# Patient Record
Sex: Male | Born: 1990
Health system: Southern US, Community
[De-identification: ages and names within clinical notes are randomized; demographics above are authoritative.]

## PROBLEM LIST (undated history)

## (undated) DIAGNOSIS — K610 Anal abscess: Secondary | ICD-10-CM

## (undated) DIAGNOSIS — K5190 Ulcerative colitis, unspecified, without complications: Secondary | ICD-10-CM

## (undated) DIAGNOSIS — F419 Anxiety disorder, unspecified: Secondary | ICD-10-CM

## (undated) DIAGNOSIS — K625 Hemorrhage of anus and rectum: Secondary | ICD-10-CM

## (undated) DIAGNOSIS — F1910 Other psychoactive substance abuse, uncomplicated: Secondary | ICD-10-CM

## (undated) DIAGNOSIS — R011 Cardiac murmur, unspecified: Secondary | ICD-10-CM

## (undated) DIAGNOSIS — K5120 Ulcerative (chronic) proctitis without complications: Secondary | ICD-10-CM

## (undated) DIAGNOSIS — K529 Noninfective gastroenteritis and colitis, unspecified: Secondary | ICD-10-CM

## (undated) DIAGNOSIS — N200 Calculus of kidney: Secondary | ICD-10-CM

## (undated) HISTORY — DX: Anal abscess: K61.0

## (undated) HISTORY — DX: Ulcerative colitis, unspecified, without complications: K51.90

## (undated) HISTORY — DX: Hemorrhage of anus and rectum: K62.5

## (undated) HISTORY — DX: Cardiac murmur, unspecified: R01.1

## (undated) HISTORY — DX: Noninfective gastroenteritis and colitis, unspecified: K52.9

## (undated) HISTORY — DX: Anxiety disorder, unspecified: F41.9

## (undated) HISTORY — PX: OTHER SURGICAL HISTORY: SHX169

---
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
## 2009-02-05 LAB — HEPATIC FUNCTION PANEL
AST: 24 U/L (ref 14–40)
Bilirubin, Total: 1.5 mg/dL

## 2009-07-23 HISTORY — PX: SIGMOIDOSCOPY: SUR1295

## 2009-11-02 ENCOUNTER — Ambulatory Visit: Payer: Self-pay | Admitting: Internal Medicine

## 2009-11-30 ENCOUNTER — Ambulatory Visit: Payer: Self-pay | Admitting: Internal Medicine

## 2010-01-25 ENCOUNTER — Ambulatory Visit: Admit: 2010-01-25 | Payer: Self-pay | Admitting: Internal Medicine

## 2010-02-14 ENCOUNTER — Ambulatory Visit: Admit: 2010-02-14 | Payer: Self-pay | Admitting: Internal Medicine

## 2010-03-01 ENCOUNTER — Ambulatory Visit (INDEPENDENT_AMBULATORY_CARE_PROVIDER_SITE_OTHER): Payer: BC Managed Care – PPO | Admitting: Internal Medicine

## 2010-03-01 DIAGNOSIS — K5190 Ulcerative colitis, unspecified, without complications: Secondary | ICD-10-CM

## 2010-04-26 ENCOUNTER — Ambulatory Visit (INDEPENDENT_AMBULATORY_CARE_PROVIDER_SITE_OTHER): Payer: BC Managed Care – PPO | Admitting: Internal Medicine

## 2010-04-26 DIAGNOSIS — K5190 Ulcerative colitis, unspecified, without complications: Secondary | ICD-10-CM

## 2010-05-09 NOTE — Consult Note (Signed)
  Benjamin Sheppard, Benjamin Sheppard               ACCOUNT NO.:  [id number]  MEDICAL RECORD NO.:  [id number]          PATIENT TYPE: AMB.  LOCATION:  [place].                   [facility]  PHYSICIAN:  Hildred Laser, M.D.          DATE :  04/27/2010                                OFFICE VISIT.   REASON FOR CONSULTATION:  Followup of ulcerative colitis.  HISTORY OF PRESENT ILLNESS:  Benjamin Sheppard is a 20 year old white male who is a patient of Dr. Scotty Sheppard.  He was diagnosed with distal ulcerative colitis in July 2011.  He is presently on Apriso for his ulcerative colitis.  He was on Lialda but his symptoms were not controlled.  His mother is present in the room.  Today, he complains of having sharp crampy pain and then he will have to go to the bathroom.  He states for the past 2 weeks he has been having 4-15 stools a day.  His Apriso was started in November.  He feels that the Benjamin Sheppard is not working.  He denies any rectal bleeding.  He has been under a lot of stress.  When he has his bowel movements, they are loose and watery.  He denies any rectal bleeding or melena.  He has had no mucous.  HOME MEDICATIONS: 1. Dicyclomine 10 mg twice a day. 2. Zyrtec 10 mg 1 a day. 3. Fluticasone nasal spray, 2 sprays every nostril every day. 4. Lorazepam 0.5 one-half tablet p.r.n. 5. Apriso 4 tablets a day.  OBJECTIVE:  VITAL SIGNS:  His weight is 162.4, he is 5 feet 11 inches, his blood pressure is 100/68, his temperature is 97.9, and his pulse is 80. HEENT:  His oral mucosa is moist.  His conjunctivae are pink.  Sclerae are anicteric. NECK:  His thyroid is normal. LUNGS:  Clear. HEART:  Regular rate and rhythm. ABDOMEN:  Soft.  Bowel sounds are present.  There is no tenderness to his abdomen.  LABORATORY DATA:  His stool today was guaiac negative and brown.  ASSESSMENT:  Benjamin Sheppard is a 20 year old male with distal ulcerative colitis.  He is presently taking Apriso 4 capsules a day.  He is  having 4-15 stools a day.  I did discuss this case with Dr. Laural Sheppard.  He possibly could be having a relapse of his ulcerative colitis.  RECOMMENDATIONS:  He will increase his Imodium to twice a day.  I will get a CBC and a C-reactive protein on him.  He will continue his Apriso. I did encourage him to take his lorazepam on an as-needed basis for his stress.  He will follow up in 1 month with Dr. Laural Sheppard.  Further recommendations once we get his lab work back.    ______________________________ Deberah Castle, NP   ______________________________ Hildred Laser, M.D.    TS/MEDQ  D:  04/27/2010  T:  04/27/2010  Job:  [id number]  Electronically Signed by Deberah Castle PA on 05/03/2010 02:42:03 PM Electronically Signed by Hildred Laser M.D. on 05/08/2010 11:33:33 PM

## 2010-06-21 ENCOUNTER — Ambulatory Visit (INDEPENDENT_AMBULATORY_CARE_PROVIDER_SITE_OTHER): Payer: BC Managed Care – PPO | Admitting: Internal Medicine

## 2010-06-23 ENCOUNTER — Ambulatory Visit (INDEPENDENT_AMBULATORY_CARE_PROVIDER_SITE_OTHER): Payer: BC Managed Care – PPO | Admitting: Internal Medicine

## 2010-06-23 DIAGNOSIS — K5190 Ulcerative colitis, unspecified, without complications: Secondary | ICD-10-CM

## 2010-07-08 ENCOUNTER — Encounter (INDEPENDENT_AMBULATORY_CARE_PROVIDER_SITE_OTHER): Payer: BC Managed Care – PPO | Admitting: Internal Medicine

## 2010-07-11 ENCOUNTER — Ambulatory Visit (INDEPENDENT_AMBULATORY_CARE_PROVIDER_SITE_OTHER): Payer: BC Managed Care – PPO | Admitting: Internal Medicine

## 2010-07-11 DIAGNOSIS — K529 Noninfective gastroenteritis and colitis, unspecified: Secondary | ICD-10-CM

## 2010-07-11 DIAGNOSIS — K625 Hemorrhage of anus and rectum: Secondary | ICD-10-CM

## 2010-07-11 DIAGNOSIS — K5190 Ulcerative colitis, unspecified, without complications: Secondary | ICD-10-CM

## 2010-07-11 HISTORY — DX: Ulcerative colitis, unspecified, without complications: K51.90

## 2010-07-11 HISTORY — DX: Hemorrhage of anus and rectum: K62.5

## 2010-07-11 HISTORY — DX: Noninfective gastroenteritis and colitis, unspecified: K52.9

## 2010-08-19 ENCOUNTER — Encounter (INDEPENDENT_AMBULATORY_CARE_PROVIDER_SITE_OTHER): Payer: Self-pay

## 2010-08-22 ENCOUNTER — Telehealth (INDEPENDENT_AMBULATORY_CARE_PROVIDER_SITE_OTHER): Payer: Self-pay | Admitting: *Deleted

## 2010-08-22 DIAGNOSIS — K5190 Ulcerative colitis, unspecified, without complications: Secondary | ICD-10-CM

## 2010-08-22 NOTE — Telephone Encounter (Signed)
[name] called and states that Benjamin Sheppard is having diarrhea , and that the medication noted is making him sick (sulfasalazine). He is very tired and cannot eat. She ask that her cell be called with recommendations.

## 2010-08-23 MED ORDER — MERCAPTOPURINE 50 MG PO TABS: 50.0000 mg | ORAL_TABLET | Freq: Every day | ORAL | Status: DC

## 2010-08-23 MED ORDER — PREDNISONE 10 MG PO TABS: 30.0000 mg | ORAL_TABLET | Freq: Every day | ORAL | Status: DC

## 2010-08-25 ENCOUNTER — Ambulatory Visit (INDEPENDENT_AMBULATORY_CARE_PROVIDER_SITE_OTHER): Payer: BC Managed Care – PPO | Admitting: Internal Medicine

## 2010-09-05 ENCOUNTER — Ambulatory Visit (INDEPENDENT_AMBULATORY_CARE_PROVIDER_SITE_OTHER): Payer: BC Managed Care – PPO | Admitting: Internal Medicine

## 2010-09-05 ENCOUNTER — Encounter (INDEPENDENT_AMBULATORY_CARE_PROVIDER_SITE_OTHER): Payer: Self-pay | Admitting: Internal Medicine

## 2010-09-05 DIAGNOSIS — K5190 Ulcerative colitis, unspecified, without complications: Secondary | ICD-10-CM

## 2010-09-05 NOTE — Progress Notes (Signed)
Presenting complaint; followup for ulcerative colitis Last visit 07/11/2010. Subjective; Benjamin Sheppard is here for scheduled visit accompanied by his mother Tye [place]. His TPMT was normal and he was begun on 6 MP 2 weeks ago. So far so good. He hasn't had any problems. He will be dropping dose of prednisone to 20 mg daily tomorrow thereafter by 5 mg every week. He is having formed stools daily. He has not seen blood with his stools since his last visit he has a very good appetite however his weight is unchanged. He has noted some fullness to his lower extremities in the evening and also has noted fullness to his face. He is admitted at this is side effect of prednisone and should resolve when he is off therapy. 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 cetirizine (ZYRTEC) 10 MG tablet Take 10 mg by mouth daily.        . mercaptopurine (PURINETHOL) 50 MG tablet Take 1 tablet (50 mg total) by mouth daily. Give on an empty stomach 1 hour before or 2 hours after meals. Caution: Chemotherapy.  30 tablet  5   objective; BP 130/74  Pulse 72  Temp(Src) 98.4 F (36.9 C) (Oral)  Ht 5' 11"  (1.803 m)  Wt 167 lb (75.751 kg)  BMI 23.29 kg/m2 He has slight round facies. Conjunctiva is pink. Sclerae nonicteric. Oropharyngeal mucosa is normal. No neck masses or thyromegaly noted. His abdomen is symmetrical soft and nontender without organomegaly or masses. No peripheral edema or clubbing noted. Assessment; Distal UC difficult to control with oral mesalamine. Hopefully with 6-MP we can induce long-term remission. Recommendations Continue prednisone taper per schedule. CBC with diff. end of the month. Office visit in 3 months.

## 2010-09-05 NOTE — Patient Instructions (Signed)
Continue prednisone taper  per schedule. CBC in 2 weeks.

## 2010-09-12 ENCOUNTER — Other Ambulatory Visit (INDEPENDENT_AMBULATORY_CARE_PROVIDER_SITE_OTHER): Payer: Self-pay | Admitting: *Deleted

## 2010-09-12 DIAGNOSIS — R197 Diarrhea, unspecified: Secondary | ICD-10-CM

## 2010-09-12 MED ORDER — DICYCLOMINE HCL 10 MG PO CAPS: ORAL_CAPSULE | ORAL | Status: DC

## 2010-09-12 NOTE — Telephone Encounter (Signed)
Rec'd a refill request for a refill Dicyclomine. Last filled 06-21-2010

## 2010-09-13 ENCOUNTER — Ambulatory Visit (INDEPENDENT_AMBULATORY_CARE_PROVIDER_SITE_OTHER): Payer: BC Managed Care – PPO | Admitting: Internal Medicine

## 2010-09-24 ENCOUNTER — Other Ambulatory Visit (INDEPENDENT_AMBULATORY_CARE_PROVIDER_SITE_OTHER): Payer: Self-pay | Admitting: Internal Medicine

## 2010-09-24 LAB — CBC WITH DIFFERENTIAL/PLATELET
Basophils Absolute: 0 10*3/uL (ref 0.0–0.1)
HCT: 42.3 % (ref 39.0–52.0)
Hemoglobin: 14.2 g/dL (ref 13.0–17.0)
Lymphocytes Relative: 43 % (ref 12–46)
MCH: 29.6 pg (ref 26.0–34.0)
MCHC: 33.6 g/dL (ref 30.0–36.0)
Monocytes Relative: 7 % (ref 3–12)
Neutro Abs: 2.7 10*3/uL (ref 1.7–7.7)
Neutrophils Relative %: 48 % (ref 43–77)
Platelets: 236 10*3/uL (ref 150–400)
RBC: 4.79 MIL/uL (ref 4.22–5.81)
RDW: 15.4 % (ref 11.5–15.5)

## 2010-09-29 ENCOUNTER — Telehealth (INDEPENDENT_AMBULATORY_CARE_PROVIDER_SITE_OTHER): Payer: Self-pay | Admitting: *Deleted

## 2010-09-29 NOTE — Telephone Encounter (Signed)
Lab due in 8 weeks

## 2010-11-30 ENCOUNTER — Encounter (INDEPENDENT_AMBULATORY_CARE_PROVIDER_SITE_OTHER): Payer: Self-pay | Admitting: *Deleted

## 2010-11-30 ENCOUNTER — Telehealth (INDEPENDENT_AMBULATORY_CARE_PROVIDER_SITE_OTHER): Payer: Self-pay | Admitting: *Deleted

## 2010-11-30 NOTE — Telephone Encounter (Signed)
Lab order faxed.

## 2010-12-17 ENCOUNTER — Other Ambulatory Visit (INDEPENDENT_AMBULATORY_CARE_PROVIDER_SITE_OTHER): Payer: Self-pay | Admitting: Internal Medicine

## 2010-12-18 LAB — CBC WITH DIFFERENTIAL/PLATELET
HCT: 40.9 % (ref 39.0–52.0)
Hemoglobin: 14 g/dL (ref 13.0–17.0)
MCH: 32 pg (ref 26.0–34.0)
MCV: 93.4 fL (ref 78.0–100.0)
Platelets: 234 10*3/uL (ref 150–400)
RBC: 4.38 MIL/uL (ref 4.22–5.81)
RDW: 14.6 % (ref 11.5–15.5)

## 2010-12-21 ENCOUNTER — Telehealth (INDEPENDENT_AMBULATORY_CARE_PROVIDER_SITE_OTHER): Payer: Self-pay | Admitting: *Deleted

## 2010-12-21 NOTE — Telephone Encounter (Signed)
Per Dr. Karilyn Cota the patient will need a CBC/d in 3 months.

## 2011-02-13 ENCOUNTER — Other Ambulatory Visit (INDEPENDENT_AMBULATORY_CARE_PROVIDER_SITE_OTHER): Payer: Self-pay | Admitting: Internal Medicine

## 2011-02-13 DIAGNOSIS — K5120 Ulcerative (chronic) proctitis without complications: Secondary | ICD-10-CM

## 2011-03-01 ENCOUNTER — Encounter (INDEPENDENT_AMBULATORY_CARE_PROVIDER_SITE_OTHER): Payer: Self-pay | Admitting: *Deleted

## 2011-03-27 ENCOUNTER — Other Ambulatory Visit (INDEPENDENT_AMBULATORY_CARE_PROVIDER_SITE_OTHER): Payer: Self-pay | Admitting: Internal Medicine

## 2011-03-28 LAB — CBC WITH DIFFERENTIAL/PLATELET
Basophils Relative: 1 % (ref 0–1)
Eosinophils Relative: 1 % (ref 0–5)
HCT: 41.5 % (ref 39.0–52.0)
Hemoglobin: 14.2 g/dL (ref 13.0–17.0)
Lymphs Abs: 1.3 10*3/uL (ref 0.7–4.0)
MCH: 31 pg (ref 26.0–34.0)
MCHC: 34.2 g/dL (ref 30.0–36.0)
MCV: 90.6 fL (ref 78.0–100.0)
Neutro Abs: 3.5 10*3/uL (ref 1.7–7.7)
Neutrophils Relative %: 64 % (ref 43–77)
Platelets: 265 10*3/uL (ref 150–400)
RBC: 4.58 MIL/uL (ref 4.22–5.81)
RDW: 14.9 % (ref 11.5–15.5)
WBC: 5.5 10*3/uL (ref 4.0–10.5)

## 2011-03-29 ENCOUNTER — Telehealth (INDEPENDENT_AMBULATORY_CARE_PROVIDER_SITE_OTHER): Payer: Self-pay | Admitting: *Deleted

## 2011-03-29 NOTE — Telephone Encounter (Signed)
Per Dr. Laural Golden the patient will need a CBC/D in 3 months. Lab is noted for June 06-13. Patient will be sent a letter as a reminder.

## 2011-06-14 ENCOUNTER — Other Ambulatory Visit (INDEPENDENT_AMBULATORY_CARE_PROVIDER_SITE_OTHER): Payer: Self-pay | Admitting: Internal Medicine

## 2011-06-23 ENCOUNTER — Other Ambulatory Visit (INDEPENDENT_AMBULATORY_CARE_PROVIDER_SITE_OTHER): Payer: Self-pay | Admitting: *Deleted

## 2011-06-23 ENCOUNTER — Encounter (INDEPENDENT_AMBULATORY_CARE_PROVIDER_SITE_OTHER): Payer: Self-pay | Admitting: *Deleted

## 2011-07-06 ENCOUNTER — Telehealth (INDEPENDENT_AMBULATORY_CARE_PROVIDER_SITE_OTHER): Payer: Self-pay | Admitting: *Deleted

## 2011-07-06 DIAGNOSIS — K5190 Ulcerative colitis, unspecified, without complications: Secondary | ICD-10-CM

## 2011-07-06 LAB — CBC WITH DIFFERENTIAL/PLATELET
Eosinophils Absolute: 0.1 10*3/uL (ref 0.0–0.7)
HCT: 43.7 % (ref 39.0–52.0)
Hemoglobin: 14.7 g/dL (ref 13.0–17.0)
Lymphs Abs: 1.5 10*3/uL (ref 0.7–4.0)
MCH: 31.3 pg (ref 26.0–34.0)
MCV: 93.2 fL (ref 78.0–100.0)
Monocytes Absolute: 0.5 10*3/uL (ref 0.1–1.0)
Neutro Abs: 3.4 10*3/uL (ref 1.7–7.7)
Neutrophils Relative %: 63 % (ref 43–77)
RBC: 4.69 MIL/uL (ref 4.22–5.81)

## 2011-07-06 NOTE — Telephone Encounter (Signed)
Per Dr.Rehman the patient will need lab work again in 3 months. Labs are noted for September 2013

## 2011-07-17 ENCOUNTER — Telehealth (INDEPENDENT_AMBULATORY_CARE_PROVIDER_SITE_OTHER): Payer: Self-pay | Admitting: *Deleted

## 2011-07-17 NOTE — Telephone Encounter (Signed)
[facility] has requested a refill on Mercaptopurine 50 mg, take 1 tablet by mouth daily on empty stomach - 1 hour before or 2 hours after a meal. Caution Chemotherapy.  Patient is going out of town and will need #35-#40 pills to have enough for trip.

## 2011-07-18 ENCOUNTER — Other Ambulatory Visit (INDEPENDENT_AMBULATORY_CARE_PROVIDER_SITE_OTHER): Payer: Self-pay | Admitting: Internal Medicine

## 2011-07-18 NOTE — Telephone Encounter (Signed)
This has been addressed.

## 2011-09-21 ENCOUNTER — Encounter (INDEPENDENT_AMBULATORY_CARE_PROVIDER_SITE_OTHER): Payer: Self-pay | Admitting: *Deleted

## 2011-09-21 ENCOUNTER — Other Ambulatory Visit (INDEPENDENT_AMBULATORY_CARE_PROVIDER_SITE_OTHER): Payer: Self-pay | Admitting: *Deleted

## 2011-09-21 DIAGNOSIS — K5190 Ulcerative colitis, unspecified, without complications: Secondary | ICD-10-CM

## 2011-11-03 LAB — CBC WITH DIFFERENTIAL/PLATELET
Basophils Relative: 2 % — ABNORMAL HIGH (ref 0–1)
Eosinophils Relative: 2 % (ref 0–5)
HCT: 38.2 % — ABNORMAL LOW (ref 39.0–52.0)
MCH: 30.8 pg (ref 26.0–34.0)
MCV: 86.4 fL (ref 78.0–100.0)
Monocytes Absolute: 0.6 10*3/uL (ref 0.1–1.0)
Monocytes Relative: 11 % (ref 3–12)
Neutrophils Relative %: 68 % (ref 43–77)
RDW: 13.9 % (ref 11.5–15.5)

## 2011-11-08 ENCOUNTER — Telehealth (INDEPENDENT_AMBULATORY_CARE_PROVIDER_SITE_OTHER): Payer: Self-pay | Admitting: *Deleted

## 2011-11-08 DIAGNOSIS — K5190 Ulcerative colitis, unspecified, without complications: Secondary | ICD-10-CM

## 2011-11-08 NOTE — Telephone Encounter (Signed)
Per Dr.Rehman the patient will need to have CBC/D in 3 months this has been noted for January 2014.

## 2012-01-04 ENCOUNTER — Encounter (INDEPENDENT_AMBULATORY_CARE_PROVIDER_SITE_OTHER): Payer: Self-pay | Admitting: *Deleted

## 2012-01-04 ENCOUNTER — Telehealth (INDEPENDENT_AMBULATORY_CARE_PROVIDER_SITE_OTHER): Payer: Self-pay | Admitting: *Deleted

## 2012-01-04 DIAGNOSIS — K5190 Ulcerative colitis, unspecified, without complications: Secondary | ICD-10-CM

## 2012-01-04 NOTE — Telephone Encounter (Signed)
Lab order done 

## 2012-02-26 ENCOUNTER — Other Ambulatory Visit (INDEPENDENT_AMBULATORY_CARE_PROVIDER_SITE_OTHER): Payer: Self-pay | Admitting: Internal Medicine

## 2012-03-25 ENCOUNTER — Other Ambulatory Visit (INDEPENDENT_AMBULATORY_CARE_PROVIDER_SITE_OTHER): Payer: Self-pay | Admitting: Internal Medicine

## 2012-04-12 NOTE — Telephone Encounter (Signed)
LM for patient to return the call.  

## 2012-04-18 NOTE — Telephone Encounter (Signed)
LM for patient to return the call.  

## 2012-05-08 NOTE — Telephone Encounter (Signed)
Patient's mother called to scheduled an apt for 05/10/12. Our office will be closed for Easter Holiday. Mother said Aureliano is away at college and once she can get with him, she will call back.

## 2012-05-22 NOTE — Telephone Encounter (Signed)
LM that this will be his last reminder call. He is needing to scheduled a f/u apt with Dr. Karilyn Cota before his next refill.

## 2012-06-03 ENCOUNTER — Ambulatory Visit (INDEPENDENT_AMBULATORY_CARE_PROVIDER_SITE_OTHER): Payer: BC Managed Care – PPO | Admitting: Internal Medicine

## 2012-06-03 ENCOUNTER — Encounter (INDEPENDENT_AMBULATORY_CARE_PROVIDER_SITE_OTHER): Payer: Self-pay | Admitting: Internal Medicine

## 2012-06-03 VITALS — BP 112/72 | HR 60 | Ht 71.0 in | Wt 156.4 lb

## 2012-06-03 DIAGNOSIS — K5190 Ulcerative colitis, unspecified, without complications: Secondary | ICD-10-CM | POA: Insufficient documentation

## 2012-06-03 LAB — CBC WITH DIFFERENTIAL/PLATELET
Basophils Absolute: 0 10*3/uL (ref 0.0–0.1)
Eosinophils Absolute: 0.1 10*3/uL (ref 0.0–0.7)
Eosinophils Relative: 2 % (ref 0–5)
HCT: 44.9 % (ref 39.0–52.0)
Hemoglobin: 15.7 g/dL (ref 13.0–17.0)
Lymphocytes Relative: 21 % (ref 12–46)
MCH: 31 pg (ref 26.0–34.0)
MCV: 88.6 fL (ref 78.0–100.0)
Platelets: 213 10*3/uL (ref 150–400)
RBC: 5.07 MIL/uL (ref 4.22–5.81)
RDW: 14.1 % (ref 11.5–15.5)
WBC: 5.8 10*3/uL (ref 4.0–10.5)

## 2012-06-03 LAB — C-REACTIVE PROTEIN: CRP: <0.5 mg/dL (ref <0.60)

## 2012-06-03 NOTE — Progress Notes (Signed)
Subjective:     Patient ID: Benjamin Sheppard, male   DOB: 1990-01-25, 22 y.o.   MRN: [id number]  HPI Benjamin Sheppard is a 22 yr old male her today for follow up of his UC. Presently taking for his UC. He was last seen in 2012 by Dr. Karilyn Cota.  Presently taking . He tells me he is not having any problems. He has 1-2 stools a day. Stools are formed. His appetite is good. No weight loss.  No rectal bleeding in over a year.  No rectal  Pain or abdominal pain .Appetite is good.     Flexible sigmoid 2011: Distal coliltis involving the rectum and sigmoid colon with transition at 35 cm from the anal margin. Endoscopic appearance typical of UC.  Review of Systems See hpi  Current Outpatient Prescriptions  Medication Sig Dispense Refill  . mercaptopurine (PURINETHOL) 50 MG tablet TAKE ONE TABLET BY MOUTH DAILY ON EMPTY STOMACH - 1 HR BEFORE OR 2 HRS AFTER A MEAL. CAUTION CHEMOTHERAPY.  30 tablet  5   No current facility-administered medications for this visit.   Current Outpatient Prescriptions on File Prior to Visit  Medication Sig Dispense Refill  . mercaptopurine (PURINETHOL) 50 MG tablet TAKE ONE TABLET BY MOUTH DAILY ON EMPTY STOMACH - 1 HR BEFORE OR 2 HRS AFTER A MEAL. CAUTION CHEMOTHERAPY.  30 tablet  5   No current facility-administered medications on file prior to visit.   Past Medical History  Diagnosis Date  . Chronic diarrhea 07/11/2010  . Rectal bleeding 07/11/2010  . UC (ulcerative colitis) 07/11/2010   Past Surgical History  Procedure Laterality Date  . Sigmoidoscopy  07/2009   No Known Allergies       Objective:   Physical Exam  Filed Vitals:   06/03/12 1105  BP: 112/72  Pulse: 60  Height: 5\' 11"  (1.803 m)  Weight: 156 lb 6.4 oz (70.943 kg)   Alert and oriented. Skin warm and dry. Oral mucosa is moist.   . Sclera anicteric, conjunctivae is pink. Thyroid not enlarged. No cervical lymphadenopathy. Lungs clear. Heart regular rate and rhythm.  Abdomen is soft. Bowel sounds  are positive. No hepatomegaly. No abdominal masses felt. No tenderness.  No edema to lower extremities.       Assessment:    UC which seems to be in remission. No GI problems.  Maintained on ;.    Plan:     CBC, CRP today. OV in 1 yr.

## 2012-06-03 NOTE — Patient Instructions (Addendum)
OV in 1 yr. 

## 2012-09-09 ENCOUNTER — Other Ambulatory Visit (INDEPENDENT_AMBULATORY_CARE_PROVIDER_SITE_OTHER): Payer: Self-pay | Admitting: Internal Medicine

## 2013-04-01 ENCOUNTER — Encounter (INDEPENDENT_AMBULATORY_CARE_PROVIDER_SITE_OTHER): Payer: Self-pay | Admitting: *Deleted

## 2013-04-09 ENCOUNTER — Other Ambulatory Visit (INDEPENDENT_AMBULATORY_CARE_PROVIDER_SITE_OTHER): Payer: Self-pay | Admitting: Internal Medicine

## 2013-07-08 ENCOUNTER — Ambulatory Visit (INDEPENDENT_AMBULATORY_CARE_PROVIDER_SITE_OTHER): Payer: BC Managed Care – PPO | Admitting: Internal Medicine

## 2013-07-31 ENCOUNTER — Telehealth (INDEPENDENT_AMBULATORY_CARE_PROVIDER_SITE_OTHER): Payer: Self-pay | Admitting: *Deleted

## 2013-07-31 NOTE — Telephone Encounter (Signed)
Benjamin Sheppard had an apt on 07/08/13 that had to be cancelled due to the provided out of office. LM for patient to return the call on 06/07/13, 07/02/13 and 07/31/13.

## 2013-07-31 NOTE — Telephone Encounter (Signed)
Noted  

## 2013-08-26 ENCOUNTER — Other Ambulatory Visit (INDEPENDENT_AMBULATORY_CARE_PROVIDER_SITE_OTHER): Payer: Self-pay | Admitting: Internal Medicine

## 2013-08-26 MED ORDER — MERCAPTOPURINE 50 MG PO TABS: ORAL_TABLET | ORAL | Status: DC

## 2013-09-10 ENCOUNTER — Ambulatory Visit (INDEPENDENT_AMBULATORY_CARE_PROVIDER_SITE_OTHER): Payer: BC Managed Care – PPO | Admitting: Internal Medicine

## 2013-09-10 ENCOUNTER — Encounter (INDEPENDENT_AMBULATORY_CARE_PROVIDER_SITE_OTHER): Payer: Self-pay | Admitting: Internal Medicine

## 2013-09-10 VITALS — BP 106/70 | HR 80 | Temp 98.8°F | Ht 72.0 in | Wt 142.9 lb

## 2013-09-10 DIAGNOSIS — K5190 Ulcerative colitis, unspecified, without complications: Secondary | ICD-10-CM

## 2013-09-10 LAB — CBC WITH DIFFERENTIAL/PLATELET
BASOS ABS: 0 10*3/uL (ref 0.0–0.1)
Basophils Relative: 0 % (ref 0–1)
EOS ABS: 0.1 10*3/uL (ref 0.0–0.7)
Eosinophils Relative: 1 % (ref 0–5)
HEMATOCRIT: 43.5 % (ref 39.0–52.0)
HEMOGLOBIN: 14.9 g/dL (ref 13.0–17.0)
LYMPHS ABS: 1.6 10*3/uL (ref 0.7–4.0)
LYMPHS PCT: 19 % (ref 12–46)
MCH: 29.9 pg (ref 26.0–34.0)
MCHC: 34.3 g/dL (ref 30.0–36.0)
MCV: 87.3 fL (ref 78.0–100.0)
MONOS PCT: 8 % (ref 3–12)
Monocytes Absolute: 0.7 10*3/uL (ref 0.1–1.0)
NEUTROS ABS: 5.9 10*3/uL (ref 1.7–7.7)
NEUTROS PCT: 72 % (ref 43–77)
PLATELETS: 204 10*3/uL (ref 150–400)
RBC: 4.98 MIL/uL (ref 4.22–5.81)
RDW: 14.1 % (ref 11.5–15.5)
WBC: 8.2 10*3/uL (ref 4.0–10.5)

## 2013-09-10 NOTE — Patient Instructions (Signed)
OV in 1 yr with DR. Rehman

## 2013-09-10 NOTE — Progress Notes (Signed)
Subjective:     Patient ID: Benjamin GlassingJustin W Sheppard, male   DOB: 09/08/1990, 23 y.o.   MRN: [id number]  HPI Here today for f/u of his UC. Last seen in August of 2014. Last seen by Dr. Karilyn Cotaehman in 2012. Presently taking 6MP for his UC. He is having 1-3 stools a day. Stools are formed. No melena of BRRB. No abdominal pain. Appetite is good.  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 He says his work his hard, but entertaining.   He tells me his irritable is much better. Flexible sigmoid 2011: Distal coliltis involving the rectum and sigmoid colon with transition at 35 cm from the anal margin. Endoscopic appearance typical of UC.    06/03/2013 CRP less than 5 CBC    Component Value Date/Time   WBC 5.8 06/03/2012 1225   RBC 5.07 06/03/2012 1225   HGB 15.7 06/03/2012 1225   HCT 44.9 06/03/2012 1225   PLT 213 06/03/2012 1225   MCV 88.6 06/03/2012 1225   MCH 31.0 06/03/2012 1225   MCHC 35.0 06/03/2012 1225   RDW 14.1 06/03/2012 1225   LYMPHSABS 1.2 06/03/2012 1225   MONOABS 0.4 06/03/2012 1225   EOSABS 0.1 06/03/2012 1225   BASOSABS 0.0 06/03/2012 1225      Review of Systems Past Medical History  Diagnosis Date  . Chronic diarrhea 07/11/2010  . Rectal bleeding 07/11/2010  . UC (ulcerative colitis) 07/11/2010    Past Surgical History  Procedure Laterality Date  . Sigmoidoscopy  07/2009    No Known Allergies  Current Outpatient Prescriptions on File Prior to Visit  Medication Sig Dispense Refill  . mercaptopurine (PURINETHOL) 50 MG tablet TAKE ONE TABLET BY MOUTH ON EMPTY STOMACH ONE HOUR BEFORE MEALS OR TWO HOURS AFTER MEALS. (CAUTION: CHEMOTHERAPY)  30 tablet  0   No current facility-administered medications on file prior to visit.        Objective:   Physical Exam  Filed Vitals:   09/10/13 1445  BP: 106/70  Pulse: 80  Temp: 98.8 F (37.1 C)  Height: 6' (1.829 m)  Weight: 142 lb 14.4 oz (64.819 kg)   Alert and oriented. Skin warm and dry. Oral mucosa is moist.   .  Sclera anicteric, conjunctivae is pink. Thyroid not enlarged. No cervical lymphadenopathy. Lungs clear. Heart regular rate and rhythm.  Abdomen is soft. Bowel sounds are positive. No hepatomegaly. No abdominal masses felt. No tenderness.  No edema to lower extremities.       Assessment:     UC. He is doing very well. No GI problems. Presently taking low dose antidressant.      Plan:    CBC, CRP. OV in 1 yr with Dr. Karilyn Cotaehman.

## 2013-09-11 LAB — C-REACTIVE PROTEIN: CRP: <0.5 mg/dL (ref <0.60)

## 2013-10-02 ENCOUNTER — Other Ambulatory Visit (INDEPENDENT_AMBULATORY_CARE_PROVIDER_SITE_OTHER): Payer: Self-pay | Admitting: Internal Medicine

## 2014-04-27 ENCOUNTER — Other Ambulatory Visit (INDEPENDENT_AMBULATORY_CARE_PROVIDER_SITE_OTHER): Payer: Self-pay | Admitting: Internal Medicine

## 2014-06-09 ENCOUNTER — Encounter (INDEPENDENT_AMBULATORY_CARE_PROVIDER_SITE_OTHER): Payer: Self-pay | Admitting: *Deleted

## 2014-09-14 ENCOUNTER — Ambulatory Visit (INDEPENDENT_AMBULATORY_CARE_PROVIDER_SITE_OTHER): Payer: Self-pay | Admitting: Internal Medicine

## 2014-10-07 ENCOUNTER — Encounter (HOSPITAL_COMMUNITY): Payer: Self-pay | Admitting: *Deleted

## 2014-10-07 ENCOUNTER — Emergency Department (HOSPITAL_COMMUNITY)
Admission: EM | Admit: 2014-10-07 | Discharge: 2014-10-08 | Disposition: A | Discharge status: Other Acute Inpatient Hospital | Payer: BLUE CROSS/BLUE SHIELD | Attending: Emergency Medicine | Admitting: Emergency Medicine

## 2014-10-07 DIAGNOSIS — F1910 Other psychoactive substance abuse, uncomplicated: Secondary | ICD-10-CM

## 2014-10-07 DIAGNOSIS — F1310 Sedative, hypnotic or anxiolytic abuse, uncomplicated: Secondary | ICD-10-CM | POA: Diagnosis not present

## 2014-10-07 DIAGNOSIS — Z8719 Personal history of other diseases of the digestive system: Secondary | ICD-10-CM | POA: Diagnosis not present

## 2014-10-07 DIAGNOSIS — F1210 Cannabis abuse, uncomplicated: Secondary | ICD-10-CM | POA: Diagnosis not present

## 2014-10-07 DIAGNOSIS — Z79899 Other long term (current) drug therapy: Secondary | ICD-10-CM | POA: Insufficient documentation

## 2014-10-07 DIAGNOSIS — Z720 Tobacco use: Secondary | ICD-10-CM | POA: Diagnosis not present

## 2014-10-07 DIAGNOSIS — F329 Major depressive disorder, single episode, unspecified: Secondary | ICD-10-CM

## 2014-10-07 DIAGNOSIS — F32A Depression, unspecified: Secondary | ICD-10-CM

## 2014-10-07 DIAGNOSIS — R110 Nausea: Secondary | ICD-10-CM | POA: Diagnosis present

## 2014-10-07 LAB — COMPREHENSIVE METABOLIC PANEL
ALBUMIN: 4.4 g/dL (ref 3.5–5.0)
ALK PHOS: 52 U/L (ref 38–126)
ALT: 15 U/L — AB (ref 17–63)
ANION GAP: 6 (ref 5–15)
AST: 23 U/L (ref 15–41)
BUN: 12 mg/dL (ref 6–20)
CALCIUM: 9.2 mg/dL (ref 8.9–10.3)
CHLORIDE: 103 mmol/L (ref 101–111)
CO2: 29 mmol/L (ref 22–32)
CREATININE: 1.1 mg/dL (ref 0.61–1.24)
GFR calc Af Amer: >60 mL/min (ref >60)
GFR calc non Af Amer: >60 mL/min (ref >60)
GLUCOSE: 141 mg/dL — AB (ref 65–99)
Potassium: 4.1 mmol/L (ref 3.5–5.1)
SODIUM: 138 mmol/L (ref 135–145)
Total Bilirubin: 1.2 mg/dL (ref 0.3–1.2)
Total Protein: 7.3 g/dL (ref 6.5–8.1)

## 2014-10-07 LAB — CBC
HCT: 40.5 % (ref 39.0–52.0)
HEMOGLOBIN: 14.2 g/dL (ref 13.0–17.0)
MCH: 31.2 pg (ref 26.0–34.0)
MCHC: 35.1 g/dL (ref 30.0–36.0)
MCV: 89 fL (ref 78.0–100.0)
PLATELETS: 199 10*3/uL (ref 150–400)
RBC: 4.55 MIL/uL (ref 4.22–5.81)
RDW: 13 % (ref 11.5–15.5)
WBC: 8.1 10*3/uL (ref 4.0–10.5)

## 2014-10-07 LAB — RAPID URINE DRUG SCREEN, HOSP PERFORMED
AMPHETAMINES: NOT DETECTED
BARBITURATES: NOT DETECTED
Benzodiazepines: POSITIVE — AB
COCAINE: NOT DETECTED
Opiates: NOT DETECTED
TETRAHYDROCANNABINOL: POSITIVE — AB

## 2014-10-07 LAB — ETHANOL: Alcohol, Ethyl (B): <5 mg/dL (ref <5)

## 2014-10-07 LAB — SALICYLATE LEVEL: Salicylate Lvl: <4 mg/dL (ref 2.8–30.0)

## 2014-10-07 LAB — ACETAMINOPHEN LEVEL: Acetaminophen (Tylenol), Serum: <10 ug/mL — ABNORMAL LOW (ref 10–30)

## 2014-10-07 MED ORDER — LORAZEPAM 1 MG PO TABS: 1.0000 mg | ORAL_TABLET | Freq: Three times a day (TID) | ORAL | Status: DC | PRN

## 2014-10-07 MED ORDER — HYDROXYZINE HCL 25 MG PO TABS: 25.0000 mg | ORAL_TABLET | Freq: Three times a day (TID) | ORAL | Status: DC | PRN

## 2014-10-07 MED ORDER — ALUM & MAG HYDROXIDE-SIMETH 200-200-20 MG/5ML PO SUSP: 30.0000 mL | ORAL | Status: DC | PRN

## 2014-10-07 MED ORDER — FLUOXETINE HCL 40 MG PO CAPS: 40.0000 mg | ORAL_CAPSULE | Freq: Every day | ORAL | Status: DC

## 2014-10-07 MED ORDER — ONDANSETRON HCL 4 MG PO TABS: 4.0000 mg | ORAL_TABLET | Freq: Three times a day (TID) | ORAL | Status: DC | PRN

## 2014-10-07 MED ORDER — CITALOPRAM HYDROBROMIDE 10 MG PO TABS
10.0000 mg | ORAL_TABLET | Freq: Every day | ORAL | Status: DC
  Filled 2014-10-07 (×2): qty 1

## 2014-10-07 MED ORDER — IBUPROFEN 400 MG PO TABS
600.0000 mg | ORAL_TABLET | Freq: Three times a day (TID) | ORAL | Status: DC | PRN
  Administered 2014-10-07: 600 mg via ORAL
  Filled 2014-10-07: qty 2

## 2014-10-07 MED ORDER — NICOTINE 21 MG/24HR TD PT24: 21.0000 mg | MEDICATED_PATCH | Freq: Every day | TRANSDERMAL | Status: DC
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
## 2014-10-07 NOTE — ED Notes (Signed)
C/o nausea, HA, emesis x 1 earlier today. Took oxycodone last night. Also on chemo pill for ulcerative colitis

## 2014-10-07 NOTE — ED Notes (Signed)
Pt wants detox from ETOH, "mollies", pain meds, marijuana. Also with SI but denies plan but feels like he may take an overdose of pills

## 2014-10-07 NOTE — BH Assessment (Addendum)
Reviewed ED notes prior to initiating assessment. Pt requesting detox from etoh and mollies, pain meds, and THC. Reports SI with plan to take and overdose.   Pt is third in line for assessment. Assessment to begin as soon as possible.    Clista Bernhardt, [facility] Triage Specialist 10/07/2014 11:16 PM

## 2014-10-08 ENCOUNTER — Inpatient Hospital Stay (HOSPITAL_COMMUNITY)
Admission: EM | Admit: 2014-10-08 | Discharge: 2014-10-12 | DRG: 885 | Disposition: A | Discharge status: Home / Self Care | Payer: BLUE CROSS/BLUE SHIELD | Source: Intra-hospital | Attending: Psychiatry | Admitting: Psychiatry

## 2014-10-08 ENCOUNTER — Encounter (HOSPITAL_COMMUNITY): Payer: Self-pay | Admitting: Behavioral Health

## 2014-10-08 DIAGNOSIS — F329 Major depressive disorder, single episode, unspecified: Secondary | ICD-10-CM | POA: Diagnosis not present

## 2014-10-08 DIAGNOSIS — K5190 Ulcerative colitis, unspecified, without complications: Secondary | ICD-10-CM | POA: Diagnosis present

## 2014-10-08 DIAGNOSIS — F332 Major depressive disorder, recurrent severe without psychotic features: Principal | ICD-10-CM | POA: Diagnosis present

## 2014-10-08 DIAGNOSIS — F17210 Nicotine dependence, cigarettes, uncomplicated: Secondary | ICD-10-CM | POA: Diagnosis present

## 2014-10-08 DIAGNOSIS — G4700 Insomnia, unspecified: Secondary | ICD-10-CM | POA: Diagnosis present

## 2014-10-08 DIAGNOSIS — F1124 Opioid dependence with opioid-induced mood disorder: Secondary | ICD-10-CM | POA: Diagnosis present

## 2014-10-08 DIAGNOSIS — F333 Major depressive disorder, recurrent, severe with psychotic symptoms: Secondary | ICD-10-CM | POA: Diagnosis not present

## 2014-10-08 DIAGNOSIS — F419 Anxiety disorder, unspecified: Secondary | ICD-10-CM | POA: Diagnosis present

## 2014-10-08 DIAGNOSIS — F1120 Opioid dependence, uncomplicated: Secondary | ICD-10-CM | POA: Diagnosis not present

## 2014-10-08 DIAGNOSIS — Z8249 Family history of ischemic heart disease and other diseases of the circulatory system: Secondary | ICD-10-CM | POA: Diagnosis not present

## 2014-10-08 DIAGNOSIS — F1994 Other psychoactive substance use, unspecified with psychoactive substance-induced mood disorder: Secondary | ICD-10-CM | POA: Diagnosis not present

## 2014-10-08 MED ORDER — MAGNESIUM HYDROXIDE 400 MG/5ML PO SUSP: 30.0000 mL | Freq: Every day | ORAL | Status: DC | PRN

## 2014-10-08 MED ORDER — HYDROXYZINE HCL 25 MG PO TABS
25.0000 mg | ORAL_TABLET | Freq: Three times a day (TID) | ORAL | Status: DC | PRN
  Administered 2014-10-08 – 2014-10-10 (×3): 25 mg via ORAL
  Filled 2014-10-08 (×3): qty 1

## 2014-10-08 MED ORDER — ONDANSETRON 4 MG PO TBDP: 4.0000 mg | ORAL_TABLET | Freq: Four times a day (QID) | ORAL | Status: DC | PRN

## 2014-10-08 MED ORDER — NAPROXEN 500 MG PO TABS: 500.0000 mg | ORAL_TABLET | Freq: Two times a day (BID) | ORAL | Status: DC | PRN

## 2014-10-08 MED ORDER — FLUOXETINE HCL 20 MG PO CAPS
40.0000 mg | ORAL_CAPSULE | Freq: Every day | ORAL | Status: DC
  Administered 2014-10-08 – 2014-10-12 (×5): 40 mg via ORAL
  Filled 2014-10-08 (×8): qty 2

## 2014-10-08 MED ORDER — BUPROPION HCL ER (XL) 150 MG PO TB24
150.0000 mg | ORAL_TABLET | Freq: Every day | ORAL | Status: DC
  Administered 2014-10-09 – 2014-10-12 (×4): 150 mg via ORAL
  Filled 2014-10-08 (×7): qty 1

## 2014-10-08 MED ORDER — CLONIDINE HCL 0.1 MG PO TABS
0.1000 mg | ORAL_TABLET | ORAL | Status: AC
  Administered 2014-10-10 – 2014-10-11 (×2): 0.1 mg via ORAL
  Filled 2014-10-08 (×4): qty 1

## 2014-10-08 MED ORDER — METHOCARBAMOL 500 MG PO TABS: 500.0000 mg | ORAL_TABLET | Freq: Three times a day (TID) | ORAL | Status: DC | PRN

## 2014-10-08 MED ORDER — DICYCLOMINE HCL 20 MG PO TABS
20.0000 mg | ORAL_TABLET | Freq: Four times a day (QID) | ORAL | Status: DC | PRN
Start: 2014-10-08 — End: 2014-10-12

## 2014-10-08 MED ORDER — TRAZODONE HCL 50 MG PO TABS
50.0000 mg | ORAL_TABLET | Freq: Every evening | ORAL | Status: DC | PRN
  Administered 2014-10-08 – 2014-10-11 (×4): 50 mg via ORAL
  Filled 2014-10-08 (×13): qty 1

## 2014-10-08 MED ORDER — ALUM & MAG HYDROXIDE-SIMETH 200-200-20 MG/5ML PO SUSP: 30.0000 mL | ORAL | Status: DC | PRN

## 2014-10-08 MED ORDER — MERCAPTOPURINE 50 MG PO TABS
50.0000 mg | ORAL_TABLET | Freq: Every day | ORAL | Status: DC
  Administered 2014-10-08 – 2014-10-12 (×5): 50 mg via ORAL
  Filled 2014-10-08 (×7): qty 1

## 2014-10-08 MED ORDER — ACETAMINOPHEN 325 MG PO TABS
650.0000 mg | ORAL_TABLET | Freq: Four times a day (QID) | ORAL | Status: DC | PRN
  Administered 2014-10-10: 650 mg via ORAL
  Filled 2014-10-08: qty 2

## 2014-10-08 MED ORDER — CLONIDINE HCL 0.1 MG PO TABS
0.1000 mg | ORAL_TABLET | Freq: Every day | ORAL | Status: DC
  Administered 2014-10-12: 0.1 mg via ORAL
  Filled 2014-10-08 (×2): qty 1

## 2014-10-08 MED ORDER — CLONIDINE HCL 0.1 MG PO TABS
0.1000 mg | ORAL_TABLET | Freq: Four times a day (QID) | ORAL | Status: AC
  Administered 2014-10-08 – 2014-10-09 (×5): 0.1 mg via ORAL
  Filled 2014-10-08 (×9): qty 1

## 2014-10-08 MED ORDER — LOPERAMIDE HCL 2 MG PO CAPS: 2.0000 mg | ORAL_CAPSULE | ORAL | Status: DC | PRN

## 2014-10-08 MED ORDER — ENSURE ENLIVE PO LIQD
237.0000 mL | Freq: Two times a day (BID) | ORAL | Status: DC
  Administered 2014-10-08: 237 mL via ORAL
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
## 2014-10-08 NOTE — ED Notes (Signed)
Pt transported to [facility] via [facility].

## 2014-10-08 NOTE — Tx Team (Signed)
Initial Interdisciplinary Treatment Plan   PATIENT STRESSORS: Loss of father 5 years ago Occupational concerns Substance abuse Traumatic event   PATIENT STRENGTHS: Ability for insight Average or above average intelligence Capable of independent living Occupational psychologist fund of knowledge Motivation for treatment/growth Physical Health Supportive family/friends Work skills   PROBLEM LIST: Problem List/Patient Goals Date to be addressed Date deferred Reason deferred Estimated date of resolution  "I want to get sober" Substance abuse 10/08/2014     "I want to get help with my depression." 10/08/2014     Grief  10/08/14     Anxiety 10/08/14     Suicide Risk 10/08/14                              DISCHARGE CRITERIA:  Ability to meet basic life and health needs Adequate post-discharge living arrangements Improved stabilization in mood, thinking, and/or behavior Motivation to continue treatment in a less acute level of care Reduction of life-threatening or endangering symptoms to within safe limits Safe-care adequate arrangements made Withdrawal symptoms are absent or subacute and managed without 24-hour nursing intervention  PRELIMINARY DISCHARGE PLAN: Attend aftercare/continuing care group Attend PHP/IOP Attend 12-step recovery group Outpatient therapy Return to previous living arrangement Return to previous work or school arrangements  PATIENT/FAMIILY INVOLVEMENT: This treatment plan has been presented to and reviewed with the patient, [place].  The patient and family have been given the opportunity to ask questions and make suggestions.  Pricilla Riffle M 10/08/2014, 4:28 AM

## 2014-10-08 NOTE — [facility] Group Notes (Signed)
[facility] Group Notes:  (Nursing/MHT/Case Management/Adjunct)  Date:  10/08/2014  Time:  11:10 AM  Type of Therapy:  Nurse Education /  Wellness Group :  The group is focused on helping patients identify why they were hospitalized, what they are doing that is working and what they currently need..influenza order to maintain their wellness.  Participation Level:  Patient did not attend.  Participation Quality:    Affect:    Cognitive:    Insight:    Engagement in Group:    Modes of Intervention:    Summary of Progress/Problems:  [place] 10/08/2014, 11:10 AM

## 2014-10-08 NOTE — [facility] Suicide Risk Assessment (Signed)
[facility] Admission Suicide Risk Assessment   Nursing information obtained from:  Patient Demographic factors:  Male, Adolescent or young adult, Caucasian, Living alone Current Mental Status:  Suicidal ideation indicated by patient, Self-harm thoughts Loss Factors:  Financial problems / change in socioeconomic status Historical Factors:  Prior suicide attempts, Family history of mental illness or substance abuse, Anniversary of important loss Risk Reduction Factors:  Sense of responsibility to family, Employed, Positive social support Total Time spent with patient: 45 minutes Principal Problem: Severe recurrent major depression without psychotic features Diagnosis:   Patient Active Problem List   Diagnosis Date Noted  . Substance induced mood disorder [F19.94] 10/08/2014  . Severe recurrent major depression without psychotic features [F33.2] 10/08/2014  . Opioid type dependence, continuous [F11.20] 10/08/2014  . UC (ulcerative colitis) [K51.90] 06/03/2012     Continued Clinical Symptoms:  Alcohol Use Disorder Identification Test Final Score (AUDIT): 20 The "Alcohol Use Disorders Identification Test", Guidelines for Use in Primary Care, Second Edition.  World Science writer [facility]). Score between 0-7:  no or low risk or alcohol related problems. Score between 8-15:  moderate risk of alcohol related problems. Score between 16-19:  high risk of alcohol related problems. Score 20 or above:  warrants further diagnostic evaluation for alcohol dependence and treatment.   CLINICAL FACTORS:   Depression:   Comorbid alcohol abuse/dependence Insomnia Severe Alcohol/Substance Abuse/Dependencies  Psychiatric Specialty Exam: Physical Exam  ROS  Blood pressure 99/60, pulse 80, temperature 97.7 F (36.5 C), temperature source Oral, resp. rate 16, height  (1.803 m), weight 70.308 kg (155 lb), SpO2 99 %.Body mass index is 21.63 kg/(m^2).    COGNITIVE FEATURES THAT CONTRIBUTE TO RISK:   Closed-mindedness, Polarized thinking and Thought constriction (tunnel vision)    SUICIDE RISK:   Moderate:  Frequent suicidal ideation with limited intensity, and duration, some specificity in terms of plans, no associated intent, good self-control, limited dysphoria/symptomatology, some risk factors present, and identifiable protective factors, including available and accessible social support.  PLAN OF CARE: See Admission H and PE  Medical Decision Making:  Review of Psycho-Social Stressors (1), Review or order clinical lab tests (1), Review of Medication Regimen & Side Effects (2) and Review of New Medication or Change in Dosage (2)  I certify that inpatient services furnished can reasonably be expected to improve the patient's condition.   [place] A 10/08/2014, 4:24 PM
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
## 2014-10-08 NOTE — [facility] Group Notes (Signed)
[facility] LCSW Group Therapy 10/08/2014  1:15 PM   Type of Therapy: Group Therapy  Participation Level: Did Not Attend. Patient invited to participate but declined.   Eustacia Urbanek, MSW, LCSWA Clinical Social Worker [facility] [phone]   
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
## 2014-10-08 NOTE — Plan of Care (Signed)
Problem: Diagnosis: Increased Risk For Suicide Attempt Goal: STG-Patient Will Report Suicidal Feelings to Staff Outcome: Progressing Pt verbally denies si thoughts today.

## 2014-10-08 NOTE — ED Notes (Signed)
Report given to RN at [facility] 

## 2014-10-08 NOTE — Progress Notes (Signed)
D:Pt rates depression as a 6 and anxiety as a 9 on 0-10 scale with 10 being the most.  Pt reports that he has much social anxiety and was having a difficult time going to school full time and trying to work part-time. Pt has been in his room much of the day. Pt refused noon clonidine and stated that he was tired wanting to stay in his bed. He was given ensure earlier this morning and took the ensure as if he was going to drink it. Writer found ensure in his room untouched. He reported to Clinical research associate later that he does not like ensure. Discontinue order written and pt encouraged to eat meals. Gingerale given. Pt denies si and hi. Safety maintained on the unit.
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
## 2014-10-08 NOTE — Progress Notes (Signed)
D: Pt denies SI/HI/AVH. Pt is pleasant and cooperative. Pt stated he would like to be set up with a counselor when he leaves. Pt still getting the feel for [facility], pt has never been in a facility like this on for more than 24 hrs. Pt stated he was doing good at this time.   A: Pt was offered support and encouragement. Pt was given scheduled medications. Pt was encourage to attend groups. Q 15 minute checks were done for safety.   R:Pt attends groups and interacts well with peers and staff. Pt is taking medication. Pt has no complaints at this time .Pt receptive to treatment and safety maintained on unit.

## 2014-10-08 NOTE — BHH Counselor (Signed)
Adult Comprehensive Assessment  Patient ID: Benjamin Sheppard, male   DOB: Apr 04, 1990, 24 y.o.   MRN: [id number]  Information Source: Information source: Patient  Current Stressors:  Bereavement / Loss: dad  passed away 5 years ago. that was traumatic for me. grandfather died 3 weeks ago. multiple uncles and cousin died of cancer.   Living/Environment/Situation:  Living Arrangements: Non-relatives/Friends Living conditions (as described by patient or guardian): pt has been living in apt near college with roomate "we keep to ourselves." "I plan to live with my mom in [place] when I leave the hospital."  How long has patient lived in current situation?: few months. prior to this, pt was living with girlfriend-"bad breakup."  What is atmosphere in current home: Comfortable  Family History:  Marital status: Single ("the breakup with my ex 3 months ago is what triggered my depression, SI, and my increased drug use." ) Does patient have children?: No  Childhood History:  By whom was/is the patient raised?: Both parents Additional childhood history information: Close to both parents. taking pain meds from aunt and decided to stay clean.  Description of patient's relationship with caregiver when they were a child: close to parents. dad has anxiety issues.  Patient's description of current relationship with people who raised him/her: dad passed away recently-difficult for patient. close with mom.  Does patient have siblings?: Yes Number of Siblings: 1 Description of patient's current relationship with siblings: one sister who lives in [place]. "We talk daily." She struggles with anxiety.  Did patient suffer any verbal/emotional/physical/sexual abuse as a child?: No Did patient suffer from severe childhood neglect?: No Has patient ever been sexually abused/assaulted/raped as an adolescent or adult?: No Was the patient ever a victim of a crime or a disaster?: No Witnessed domestic violence?: No Has patient  been effected by domestic violence as an adult?: No  Education:  Highest grade of school patient has completed: Holiday representative in [employer]. "I need to take the rest of the semester off to get my head straight"  Currently a student?: Yes If yes, how has current illness impacted academic performance: "I can't focus on schoolwork, anxious, unable to concentrate."  Name of school: [employer] Contact person: n/a  How long has the patient attended?: half a semester. prior to this, pt was at [employer].  Learning disability?: No  Employment/Work Situation:   Employment situation: Employed Where is patient currently employed?: [employer] How long has patient been employed?: 1 1/2 years  Patient's job has been impacted by current illness: Yes Describe how patient's job has been impacted: unable to concentrate; poor work International aid/development worker  What is the longest time patient has a held a job?: see above Where was the patient employed at that time?: see above.  Has patient ever been in the [employer]?: No Has patient ever served in combat?: No  Financial Resources:   Financial resources: Income from employment, Media planner, Support from parents / caregiver Does patient have a Lawyer or guardian?: No  Alcohol/Substance Abuse:   What has been your use of drugs/alcohol within the last 12 months?: pt reports pain pill abuse for past 3 years. increased significantly in Dec 2015.  If attempted suicide, did drugs/alcohol play a role in this?: Yes (overdose"not sure if it was intentional") Alcohol/Substance Abuse Treatment Hx: Denies past history If yes, describe treatment: pt reports spending one day at psych hospital in [place] a few years ago for one day "but not for drug use."  Has alcohol/substance abuse ever caused  legal problems?: No  Social Support System:   Patient's Community Support System: Fair Museum/gallery exhibitions officer System: some friends. strong family support. (mom and sister) Type of  faith/religion: n/a  How does patient's faith help to cope with current illness?: n/a   Leisure/Recreation:   Leisure and Hobbies: spending time with family.  Strengths/Needs:   What things does the patient do well?: hard worker; motivated "to get head straight" family supports In what areas does patient struggle / problems for patient: struggles with coping with grief, pain pill abuse, SI/depression, PTSD symptoms "My father died in my arms."   Discharge Plan:   Does patient have access to transportation?: Yes Will patient be returning to same living situation after discharge?: No Plan for living situation after discharge: Pt plans to live with his mother in [place], [place] Currently receiving community mental health services: No If no, would patient like referral for services when discharged?: Yes (What county[place]) Does patient have financial barriers related to discharge medications?: No (income and private insurance)  Summary/Recommendations:    Pt is 24 year old male who voluntarily presents to [facility] for opioid abuse, depression, passive SI, and for medication stabilization. 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 He experienced multiple family deaths (including the death of his father) in the past 3-4 years, romantic relationship loss 3 months ago, and increased depression. His PCP recently prescribed pt Prozac. Pt reports pain pill abuse on and off for the past 3 years with a significant increase in Dec 2015. Pt also reports occasional benzo abuse. Recommendations for pt include: crisis stabilization, therapeutic milieu, encourage group attendance and participation, medication management for mood stabilization/withdrawal symptoms, and development of comprehensive mental wellness/sobriety plan. Pt plans to take a break from school for the rest of the semester, and return home with his mother in [place], [place] [facility] [place]). Pt's PCP prescribes  mental health medication. CSW assessing for appropriate referrals for therapy. Pt has hx at [facility] but does not want to return there.   Smart, Angelino Rumery LCSWA 10/08/2014 10:15 AM

## 2014-10-08 NOTE — Plan of Care (Signed)
Problem: Ineffective individual coping Goal: STG: Patient will remain free from self harm Outcome: Progressing Pt safe on the unit at this time     

## 2014-10-08 NOTE — Progress Notes (Signed)
NUTRITION ASSESSMENT  Pt identified as at risk on the Malnutrition Screen Tool  INTERVENTION: 1. Educated patient on the importance of nutrition and encouraged intake of food and beverages. 2. Discussed weight goals. 3. Supplements: continue Ensure Enlive BID, each supplement provides 350 kcal and 20 grams of protein  NUTRITION DIAGNOSIS: Predicted sub optimal intakes related to N/V on admission, depression, polysubstance abuse as evidenced by chart review.  Goal: Pt to meet >/= 90% of their estimated nutrition needs.  Monitor:  PO intake  Assessment:  Pt seen for MST. Pt admitted for depression, SI, substance abuse. Notes indicate that he has been using drugs x3 years to deal with his depression.   Pt has hx of ulcerative colitis which he states has been in remission x2 years and has not caused recent issues for him.  Ensure Enlive has been ordered BID. Pt's weight has been stable recently although he did gain 13 lbs in the past 13 months which is likely beneficial.   24 y.o. male  Height: Ht Readings from Last 1 Encounters:  10/08/14  (1.803 m)    Weight: Wt Readings from Last 1 Encounters:  10/08/14 155 lb (70.308 kg)    Weight Hx: Wt Readings from Last 10 Encounters:  10/08/14 155 lb (70.308 kg)  10/07/14 150 lb (68.04 kg)  09/10/13 142 lb 14.4 oz (64.819 kg)  06/03/12 156 lb 6.4 oz (70.943 kg)  09/05/10 167 lb (75.751 kg)    BMI:  Body mass index is 21.63 kg/(m^2). Pt meets criteria for normal weight based on current BMI.  Estimated Nutritional Needs: Kcal: 25-30 kcal/kg Protein: > 1 gram protein/kg Fluid: 1 ml/kcal  Diet Order: Diet regular Room service appropriate?: Yes; Fluid consistency:: Thin Pt is also offered choice of unit snacks mid-morning and mid-afternoon.  Pt is eating as desired.   Lab results and medications reviewed.      Trenton Gammon, RD, LDN Inpatient Clinical Dietitian Pager # [phone] After hours/weekend pager #  [phone]

## 2014-10-09 NOTE — BHH Suicide Risk Assessment (Signed)
BHH INPATIENT:  Family/Significant Other Suicide Prevention Education  Suicide Prevention Education:  Patient Refusal for Family/Significant Other Suicide Prevention Education: The patient Benjamin Sheppard has refused to provide written consent for family/significant other to be provided Family/Significant Other Suicide Prevention Education during admission and/or prior to discharge.  Physician notified.  SPE completed with pt, as pt refused to consent to family contact. SPI pamphlet provided to pt and pt was encouraged to share information with support network, ask questions, and talk about any concerns relating to SPE. Pt denies access to guns/firearms and verbalized understanding of information provided. Mobile Crisis information also provided to pt.    Smart, Heather LCSWA  10/09/2014, 3:35 PM

## 2014-10-09 NOTE — Clinical Social Work Note (Signed)
Per pt request. CSW contacted pt's work ([employer] in [place], [place]) to inform that pt is currently hospitalized and will be provided with letter at d/c.  Trula Slade, LCSWA Clinical Social Worker 10/09/2014 3:54 PM

## 2014-10-09 NOTE — BHH Group Notes (Signed)
[facility] LCSW Aftercare Discharge Planning Group Note   10/09/2014 10:20 AM  Participation Quality:  Invited-DID NOT ATTEND> pt chose to remain in bed this morning.   Smart, [employer]

## 2014-10-09 NOTE — Progress Notes (Signed)
D: Patient is pleasant upon approach.  He denies SI/HI/AVH. He reports decreased depressive symptoms and minimal withdrawal symptoms.  When asked about his depression, he stated, "I'm not depressed.  I just like to sleep late."  Patient has minimal interaction with staff and peers.  He did not attend group this morning, however, did attend recreation therapy. A: Continue to monitor medication management and MD orders.  Safety checks completed every 15 minutes per protocol.  Offer support and encouragement as needed. R: Patient's behavior is appropriate to situation.

## 2014-10-09 NOTE — BHH Group Notes (Signed)
Adult Psychoeducational Group Note  Date:  10/09/2014 Time:  11:12 PM  Group Topic/Focus:  AA Meeting  Participation Level:  Minimal  Participation Quality:  Attentive  Affect:  Flat  Cognitive:  Alert  Insight: Good  Engagement in Group:  Engaged  Modes of Intervention:  Discussion and Education  Additional Comments:  Patient attended group.  Caroll Rancher A 10/09/2014, 11:12 PM

## 2014-10-09 NOTE — Progress Notes (Signed)
[facility] MD Progress Note  10/09/2014 1:25 PM Joseph Bias Frangos  MRN:  [id number] Subjective:  Benjamin Sheppard is trying to get his life back together. He is going to get a medical withdrawal from school. He is going to stay with his mother in [place] and get a new job. He plans to use the next several months to address his depression and his addiction so he can get back in school the Spring Semester. He states he knows he needs to do the therapy work. He is also limited by his social anxiety what he has tried to deal with on his own but now knows he needs outside help.  Principal Problem: Severe recurrent major depression without psychotic features Diagnosis:   Patient Active Problem List   Diagnosis Date Noted  . Substance induced mood disorder [F19.94] 10/08/2014  . Severe recurrent major depression without psychotic features [F33.2] 10/08/2014  . Opioid type dependence, continuous [F11.20] 10/08/2014  . UC (ulcerative colitis) [K51.90] 06/03/2012   Total Time spent with patient: 30 minutes   Past Medical History:  Past Medical History  Diagnosis Date  . Chronic diarrhea 07/11/2010  . Rectal bleeding 07/11/2010  . UC (ulcerative colitis) 07/11/2010    Past Surgical History  Procedure Laterality Date  . Sigmoidoscopy  07/2009   Family History:  Family History  Problem Relation Age of Onset  . Healthy Mother   . Heart attack Father   . Healthy Sister    Social History:  History  Alcohol Use No     History  Drug Use  . Yes  . Special: Oxycodone, Marijuana, Benzodiazepines    Social History   Social History  . Marital Status: Single    Spouse Name: N/A  . Number of Children: N/A  . Years of Education: N/A   Social History Main Topics  . Smoking status: Current Every Day Smoker -- 0.50 packs/day    Types: Cigarettes  . Smokeless tobacco: Never Used  . Alcohol Use: No  . Drug Use: Yes    Special: Oxycodone, Marijuana, Benzodiazepines  . Sexual Activity: Yes    Birth Control/  Protection: Condom   Other Topics Concern  . None   Social History Narrative   Additional History:    Sleep: Fair  Appetite:  Fair   Assessment:   Musculoskeletal: Strength & Muscle Tone: within normal limits Gait & Station: normal Patient leans: normal   Psychiatric Specialty Exam: Physical Exam  Review of Systems  Constitutional: Positive for malaise/fatigue.  HENT: Negative.   Eyes: Negative.   Respiratory: Negative.   Cardiovascular: Negative.   Gastrointestinal: Negative.   Genitourinary: Negative.   Musculoskeletal: Negative.   Skin: Negative.   Neurological: Positive for weakness.  Endo/Heme/Allergies: Negative.   Psychiatric/Behavioral: Positive for depression and substance abuse. The patient is nervous/anxious.     Blood pressure 103/53, pulse 60, temperature 98.1 F (36.7 C), temperature source Oral, resp. rate 20, height 5' 11"  (1.803 m), weight 70.308 kg (155 lb), SpO2 99 %.Body mass index is 21.63 kg/(m^2).  General Appearance: Fairly Groomed  Engineer, water::  Fair  Speech:  Clear and Coherent  Volume:  Normal  Mood:  Anxious and Depressed  Affect:  Depressed and anxious worried  Thought Process:  Coherent and Goal Directed  Orientation:  Full (Time, Place, and Person)  Thought Content:  symptoms events worries concerns  Suicidal Thoughts:  No  Homicidal Thoughts:  No  Memory:  Immediate;   Fair Recent;   Fair Remote;  Fair  Judgement:  Fair  Insight:  Present  Psychomotor Activity:  Restlessness  Concentration:  Fair  Recall:  [employer] of Knowledge:Fair  Language: Fair  Akathisia:  No  Handed:  Right  AIMS (if indicated):     Assets:  Desire for Improvement Housing Social Support Talents/Skills Vocational/Educational  ADL's:  Intact  Cognition: WNL  Sleep:        Current Medications: Current Facility-Administered Medications  Medication Dose Route Frequency Provider Last Rate Last Dose  . acetaminophen (TYLENOL) tablet 650  mg  650 mg Oral Q6H PRN Laverle Hobby, PA-C      . alum & mag hydroxide-simeth (MAALOX/MYLANTA) 200-200-20 MG/5ML suspension 30 mL  30 mL Oral Q4H PRN Laverle Hobby, PA-C      . buPROPion (WELLBUTRIN XL) 24 hr tablet 150 mg  150 mg Oral Daily Nicholaus Bloom, MD   150 mg at 10/09/14 0916  . cloNIDine (CATAPRES) tablet 0.1 mg  0.1 mg Oral QID Laverle Hobby, PA-C   0.1 mg at 10/09/14 1159   Followed by  . [START ON 10/10/2014] cloNIDine (CATAPRES) tablet 0.1 mg  0.1 mg Oral BH-qamhs Spencer E Simon, PA-C       Followed by  . [START ON 10/12/2014] cloNIDine (CATAPRES) tablet 0.1 mg  0.1 mg Oral QAC breakfast Laverle Hobby, PA-C      . dicyclomine (BENTYL) tablet 20 mg  20 mg Oral Q6H PRN Laverle Hobby, PA-C      . FLUoxetine (PROZAC) capsule 40 mg  40 mg Oral Daily Laverle Hobby, PA-C   40 mg at 10/09/14 6301  . hydrOXYzine (ATARAX/VISTARIL) tablet 25 mg  25 mg Oral TID PRN Laverle Hobby, PA-C   25 mg at 10/08/14 2154  . loperamide (IMODIUM) capsule 2-4 mg  2-4 mg Oral PRN Laverle Hobby, PA-C      . magnesium hydroxide (MILK OF MAGNESIA) suspension 30 mL  30 mL Oral Daily PRN Laverle Hobby, PA-C      . mercaptopurine (PURINETHOL) tablet 50 mg  50 mg Oral QAC breakfast Nicholaus Bloom, MD   50 mg at 10/09/14 0916  . methocarbamol (ROBAXIN) tablet 500 mg  500 mg Oral Q8H PRN Laverle Hobby, PA-C      . naproxen (NAPROSYN) tablet 500 mg  500 mg Oral BID PRN Laverle Hobby, PA-C      . ondansetron (ZOFRAN-ODT) disintegrating tablet 4 mg  4 mg Oral Q6H PRN Laverle Hobby, PA-C      . traZODone (DESYREL) tablet 50 mg  50 mg Oral QHS,MR X 1 Laverle Hobby, PA-C   50 mg at 10/08/14 2154    Lab Results:  Results for orders placed or performed during the hospital encounter of 10/07/14 (from the past 48 hour(s))  CBC     Status: None   Collection Time: 10/07/14 11:11 PM  Result Value Ref Range   WBC 8.1 4.0 - 10.5 K/uL   RBC 4.55 4.22 - 5.81 MIL/uL   Hemoglobin 14.2 13.0 - 17.0 g/dL    HCT 40.5 39.0 - 52.0 %   MCV 89.0 78.0 - 100.0 fL   MCH 31.2 26.0 - 34.0 pg   MCHC 35.1 30.0 - 36.0 g/dL   RDW 13.0 11.5 - 15.5 %   Platelets 199 150 - 400 K/uL  Comprehensive metabolic panel     Status: Abnormal   Collection Time: 10/07/14 11:11 PM  Result Value Ref  Range   Sodium 138 135 - 145 mmol/L   Potassium 4.1 3.5 - 5.1 mmol/L   Chloride 103 101 - 111 mmol/L   CO2 29 22 - 32 mmol/L   Glucose, Bld 141 (H) 65 - 99 mg/dL   BUN 12 6 - 20 mg/dL   Creatinine, Ser 1.10 0.61 - 1.24 mg/dL   Calcium 9.2 8.9 - 10.3 mg/dL   Total Protein 7.3 6.5 - 8.1 g/dL   Albumin 4.4 3.5 - 5.0 g/dL   AST 23 15 - 41 U/L   ALT 15 (L) 17 - 63 U/L   Alkaline Phosphatase 52 38 - 126 U/L   Total Bilirubin 1.2 0.3 - 1.2 mg/dL   GFR calc non Af Amer >60 >60 mL/min   GFR calc Af Amer >60 >60 mL/min    Comment: (NOTE) The eGFR has been calculated using the CKD EPI equation. This calculation has not been validated in all clinical situations. eGFR's persistently <60 mL/min signify possible Chronic Kidney Disease.    Anion gap 6 5 - 15  Ethanol     Status: None   Collection Time: 10/07/14 11:11 PM  Result Value Ref Range   Alcohol, Ethyl (B) <5 <5 mg/dL    Comment:        LOWEST DETECTABLE LIMIT FOR SERUM ALCOHOL IS 5 mg/dL FOR MEDICAL PURPOSES ONLY   Acetaminophen level     Status: Abnormal   Collection Time: 10/07/14 11:11 PM  Result Value Ref Range   Acetaminophen (Tylenol), Serum <10 (L) 10 - 30 ug/mL    Comment:        THERAPEUTIC CONCENTRATIONS VARY SIGNIFICANTLY. A RANGE OF 10-30 ug/mL MAY BE AN EFFECTIVE CONCENTRATION FOR MANY PATIENTS. HOWEVER, SOME ARE BEST TREATED AT CONCENTRATIONS OUTSIDE THIS RANGE. ACETAMINOPHEN CONCENTRATIONS >150 ug/mL AT 4 HOURS AFTER INGESTION AND >50 ug/mL AT 12 HOURS AFTER INGESTION ARE OFTEN ASSOCIATED WITH TOXIC REACTIONS.   Salicylate level     Status: None   Collection Time: 10/07/14 11:11 PM  Result Value Ref Range   Salicylate Lvl <5.7 2.8 -  30.0 mg/dL  Urine rapid drug screen (hosp performed)     Status: Abnormal   Collection Time: 10/07/14 11:40 PM  Result Value Ref Range   Opiates NONE DETECTED NONE DETECTED   Cocaine NONE DETECTED NONE DETECTED   Benzodiazepines POSITIVE (A) NONE DETECTED   Amphetamines NONE DETECTED NONE DETECTED   Tetrahydrocannabinol POSITIVE (A) NONE DETECTED   Barbiturates NONE DETECTED NONE DETECTED    Comment:        DRUG SCREEN FOR MEDICAL PURPOSES ONLY.  IF CONFIRMATION IS NEEDED FOR ANY PURPOSE, NOTIFY LAB WITHIN 5 DAYS.        LOWEST DETECTABLE LIMITS FOR URINE DRUG SCREEN Drug Class       Cutoff (ng/mL) Amphetamine      1000 Barbiturate      200 Benzodiazepine   017 Tricyclics       793 Opiates          300 Cocaine          300 THC              50     Physical Findings: AIMS: Facial and Oral Movements Muscles of Facial Expression: None, normal Lips and Perioral Area: None, normal Jaw: None, normal Tongue: None, normal,Extremity Movements Upper (arms, wrists, hands, fingers): None, normal Lower (legs, knees, ankles, toes): None, normal, Trunk Movements Neck, shoulders, hips: None, normal, Overall Severity Severity of abnormal  movements (highest score from questions above): None, normal Incapacitation due to abnormal movements: None, normal Patient's awareness of abnormal movements (rate only patient's report): No Awareness, Dental Status Current problems with teeth and/or dentures?: No Does patient usually wear dentures?: No  CIWA:  CIWA-Ar Total: 6 COWS:  COWS Total Score: 0  Treatment Plan Summary: Daily contact with patient to assess and evaluate symptoms and progress in treatment and Medication management Supportive approach/coping skills Opioid dependence; continue the clonidine detox protocol Work a relapse prevention plan Depression; continue the Prozac 40 mg add Wellbutrin XL 150 mg in AM Work with CBT/mindfulness   Medical Decision Making:  Review of  Psycho-Social Stressors (1), Review of Medication Regimen & Side Effects (2) and Review of New Medication or Change in Dosage (2)     LUGO,IRVING A 10/09/2014, 1:25 PM

## 2014-10-09 NOTE — Progress Notes (Signed)
Encouraged fluids.  Clonidine medication education done.

## 2014-10-09 NOTE — [facility] Group Notes (Signed)
[facility] LCSW Group Therapy  10/09/2014 11:49 AM  Type of Therapy:  Group Therapy  Participation Level:  Did Not Attend-invited. Pt chose to stay in room/rest.   Summary of Progress/Problems: Feelings around Relapse. Group members discussed the meaning of relapse and shared personal stories of relapse, how it affected them and others, and how they perceived themselves during this time. Group members were encouraged to identify triggers, warning signs and coping skills used when facing the possibility of relapse. Social supports were discussed and explored in detail. Post Acute Withdrawal Syndrome (handout provided) was introduced and examined. Pt's were encouraged to ask questions, talk about key points associated with PAWS, and process this information in terms of relapse prevention.   Smart, Heather LCSWA  10/09/2014, 11:49 AM

## 2014-10-09 NOTE — Progress Notes (Signed)
D: Pt denies SI/HI/AVH. Pt is pleasant and cooperative. Pt observed interacting with peers and staff in the dayroom. Pt appears to really want to get his life together. Pt appears to be trying to help his situation.   A: Pt was offered support and encouragement. Pt was given scheduled medications. Pt was encourage to attend groups. Q 15 minute checks were done for safety.    R:Pt attends groups and interacts well with peers and staff. Pt is taking medication. Pt has no complaints at this time .Pt receptive to treatment and safety maintained on unit.

## 2014-10-09 NOTE — Plan of Care (Signed)
Problem: Alteration in mood; excessive anxiety as evidenced by: Goal: LTG-Patient's behavior demonstrates decreased anxiety (Patient's behavior demonstrates anxiety and he/she is utilizing learned coping skills to deal with anxiety-producing situations)  Outcome: Progressing Pt seen interacting in dayroom with peers.

## 2014-10-10 ENCOUNTER — Encounter (HOSPITAL_COMMUNITY): Payer: Self-pay | Admitting: Registered Nurse

## 2014-10-10 DIAGNOSIS — F332 Major depressive disorder, recurrent severe without psychotic features: Principal | ICD-10-CM

## 2014-10-10 NOTE — Progress Notes (Signed)
D- Patient is depressed and anxious.  He currently denies SI, HI, AVH, and pain.  Patient reports low energy and rates his depression, feelings of hopelessness, and anxiety "4/10" with 10 being the worst on self-inventory sheet.  No complaints. A- Scheduled medications administered to patient, per MD orders. Support and encouragement provided.  Routine safety checks conducted every 15 minutes.  Patient informed to notify staff with problems or concerns. R- No adverse drug reactions noted. Patient contracts for safety at this time. Patient compliant with medications and treatment plan. Patient receptive, calm, and cooperative. Patient interacts well with others on the unit.  Patient remains safe at this time.

## 2014-10-10 NOTE — Progress Notes (Signed)
Patient did attend the evening speaker AA meeting.  

## 2014-10-10 NOTE — [facility] Group Notes (Signed)
[facility] Group Notes:  (Nursing/MHT/Case Management/Adjunct)  Date:  10/10/2014  Time:  0930  Type of Therapy:  Nurse Education  Participation Level:  Did Not Attend  Participation Quality:  Did Not Attend  Affect:  Did Not Attend  Cognitive:  Did Not Attend  Insight:  None  Engagement in Group:  None  Modes of Intervention:  Discussion and Education  Summary of Progress/Problems: Patient did not attend group.  He was observed laying in bed with his eyes closed during group time.  Today's group focused on Healthy Coping Skills.  Larry Sierras P 10/10/2014, 0930

## 2014-10-10 NOTE — Progress Notes (Signed)
[facility] MD Progress Note  10/10/2014 2:50 PM Benjamin Sheppard  MRN:  [id number]   Subjective:  Patient states "I'm doing pretty good. I'm feeling better; medicine is making me a little sleepy; but the headache is starting to go away.  " Depression 4-5/10 and Anxiety 3-4/10.  States "When I came I had a lot of anxiety but it has gotten a lot better." Patient states that he is tolerating he medication fine other than making him "a little sleepy."  States that he is attending and participating in group sessions "all other than the morning one because I'm a late sleeper."   Principal Problem: Severe recurrent major depression without psychotic features Diagnosis:   Patient Active Problem List   Diagnosis Date Noted  . Substance induced mood disorder [F19.94] 10/08/2014  . Severe recurrent major depression without psychotic features [F33.2] 10/08/2014  . Opioid type dependence, continuous [F11.20] 10/08/2014  . UC (ulcerative colitis) [K51.90] 06/03/2012   Total Time spent with patient: 30 minutes   Past Medical History:  Past Medical History  Diagnosis Date  . Chronic diarrhea 07/11/2010  . Rectal bleeding 07/11/2010  . UC (ulcerative colitis) 07/11/2010    Past Surgical History  Procedure Laterality Date  . Sigmoidoscopy  07/2009   Family History:  Family History  Problem Relation Age of Onset  . Healthy Mother   . Heart attack Father   . Healthy Sister    Social History:  History  Alcohol Use No     History  Drug Use  . Yes  . Special: Oxycodone, Marijuana, Benzodiazepines    Social History   Social History  . Marital Status: Single    Spouse Name: N/A  . Number of Children: N/A  . Years of Education: N/A   Social History Main Topics  . Smoking status: Current Every Day Smoker -- 0.50 packs/day    Types: Cigarettes  . Smokeless tobacco: Never Used  . Alcohol Use: No  . Drug Use: Yes    Special: Oxycodone, Marijuana, Benzodiazepines  . Sexual Activity: Yes   Birth Control/ Protection: Condom   Other Topics Concern  . None   Social History Narrative   Additional History:    Sleep: Fair, Improving  Appetite:  Fair, Improving over the last day   Assessment:   Musculoskeletal: Strength & Muscle Tone: within normal limits Gait & Station: normal Patient leans: normal   Psychiatric Specialty Exam: Physical Exam  Nursing note and vitals reviewed. Constitutional: He is oriented to person, place, and time.  Neck: Normal range of motion.  Respiratory: Effort normal.  Musculoskeletal: Normal range of motion.  Neurological: He is alert and oriented to person, place, and time.    Review of Systems  Psychiatric/Behavioral: Positive for depression and substance abuse. The patient is nervous/anxious.   All other systems reviewed and are negative.   Blood pressure 106/60, pulse 44, temperature 97.4 F (36.3 C), temperature source Oral, resp. rate 18, height 5' 11"  (1.803 m), weight 70.308 kg (155 lb), SpO2 99 %.Body mass index is 21.63 kg/(m^2).  General Appearance: Casual and Fairly Groomed  Engineer, water::  Good  Speech:  Clear and Coherent  Volume:  Normal  Mood:  Anxious and Depressed  Affect:  Depressed and anxious  Thought Process:  Coherent and Goal Directed  Orientation:  Full (Time, Place, and Person)  Thought Content:  Denies hallucinations, delusions, and paranoia  Suicidal Thoughts:  No  Homicidal Thoughts:  No  Memory:  Immediate;   Good  Recent;   Good Remote;   Good  Judgement:  Fair  Insight:  Present  Psychomotor Activity:  Restlessness  Concentration:  Fair  Recall:  Good  Fund of Knowledge:Good  Language: Good  Akathisia:  No  Handed:  Right  AIMS (if indicated):     Assets:  Desire for Improvement Housing Social Support Talents/Skills Vocational/Educational  ADL's:  Intact  Cognition: WNL  Sleep:        Current Medications: Current Facility-Administered Medications  Medication Dose Route Frequency  [place] Last Rate Last Dose  . acetaminophen (TYLENOL) tablet 650 mg  650 mg Oral Q6H PRN Laverle Hobby, PA-C      . alum & mag hydroxide-simeth (MAALOX/MYLANTA) 200-200-20 MG/5ML suspension 30 mL  30 mL Oral Q4H PRN Laverle Hobby, PA-C      . buPROPion (WELLBUTRIN XL) 24 hr tablet 150 mg  150 mg Oral Daily Nicholaus Bloom, MD   150 mg at 10/10/14 0839  . cloNIDine (CATAPRES) tablet 0.1 mg  0.1 mg Oral BH-qamhs Spencer E Simon, PA-C   0.1 mg at 10/10/14 0840   Followed by  . [START ON 10/12/2014] cloNIDine (CATAPRES) tablet 0.1 mg  0.1 mg Oral QAC breakfast Laverle Hobby, PA-C      . dicyclomine (BENTYL) tablet 20 mg  20 mg Oral Q6H PRN Laverle Hobby, PA-C      . FLUoxetine (PROZAC) capsule 40 mg  40 mg Oral Daily Laverle Hobby, PA-C   40 mg at 10/10/14 7867  . hydrOXYzine (ATARAX/VISTARIL) tablet 25 mg  25 mg Oral TID PRN Laverle Hobby, PA-C   25 mg at 10/09/14 2136  . loperamide (IMODIUM) capsule 2-4 mg  2-4 mg Oral PRN Laverle Hobby, PA-C      . magnesium hydroxide (MILK OF MAGNESIA) suspension 30 mL  30 mL Oral Daily PRN Laverle Hobby, PA-C      . mercaptopurine (PURINETHOL) tablet 50 mg  50 mg Oral QAC breakfast Nicholaus Bloom, MD   50 mg at 10/10/14 6720  . methocarbamol (ROBAXIN) tablet 500 mg  500 mg Oral Q8H PRN Laverle Hobby, PA-C      . naproxen (NAPROSYN) tablet 500 mg  500 mg Oral BID PRN Laverle Hobby, PA-C      . ondansetron (ZOFRAN-ODT) disintegrating tablet 4 mg  4 mg Oral Q6H PRN Laverle Hobby, PA-C      . traZODone (DESYREL) tablet 50 mg  50 mg Oral QHS,MR X 1 Laverle Hobby, PA-C   50 mg at 10/09/14 2136    Lab Results:  No results found for this or any previous visit (from the past 48 hour(s)).  Physical Findings: AIMS: Facial and Oral Movements Muscles of Facial Expression: None, normal Lips and Perioral Area: None, normal Jaw: None, normal Tongue: None, normal,Extremity Movements Upper (arms, wrists, hands, fingers): None, normal Lower (legs,  knees, ankles, toes): None, normal, Trunk Movements Neck, shoulders, hips: None, normal, Overall Severity Severity of abnormal movements (highest score from questions above): None, normal Incapacitation due to abnormal movements: None, normal Patient's awareness of abnormal movements (rate only patient's report): No Awareness, Dental Status Current problems with teeth and/or dentures?: No Does patient usually wear dentures?: No  CIWA:  CIWA-Ar Total: 6 COWS:  COWS Total Score: 0  Treatment Plan Summary: Daily contact with patient to assess and evaluate symptoms and progress in treatment and Medication management   Supportive approach/coping skills Opioid dependence; continue the clonidine  detox protocol Work a relapse prevention plan Depression; continue the Prozac 40 mg add Wellbutrin XL 150 mg in AM Work with CBT/mindfulness  Patient continues to improve.  Will continue with current treatment plan; no changes at this time.    Medical Decision Making:  Established Problem, Stable/Improving (1), Review of Psycho-Social Stressors (1), Review of Last Therapy Session (1) and Review of Medication Regimen & Side Effects (2)   Rankin, Shuvon, FNP-BC 10/10/2014, 2:50 PM

## 2014-10-10 NOTE — [facility] Group Notes (Signed)
[facility] Group Notes: (Clinical Social Work)   10/10/2014      Type of Therapy:  Group Therapy   Participation Level:  Did Not Attend despite MHT prompting   Ambrose Mantle, LCSW 10/10/2014, 12:42 PM

## 2014-10-10 NOTE — Progress Notes (Signed)
D: Pt denies SI/HI/AVH. Pt is pleasant and cooperative. Pt plans to go home with mom on D/C and hopes to get job. Pt plans to attend out patient, pt appears to have good insight into Tx, pt hopeful about future.   A: Pt was offered support and encouragement. Pt was given scheduled medications. Pt was encourage to attend groups. Q 15 minute checks were done for safety.   R:Pt attends groups and interacts well with peers and staff. Pt is taking medication. Pt has no complaints at this time .Pt receptive to treatment and safety maintained on unit.

## 2014-10-10 NOTE — Plan of Care (Signed)
Problem: Ineffective individual coping Goal: LTG: Patient will report a decrease in negative feelings Pt stated he felt better and was hopeful about the future  Problem: Alteration in mood Goal: LTG-Patient reports reduction in suicidal thoughts (Patient reports reduction in suicidal thoughts and is able to verbalize a safety plan for whenever patient is feeling suicidal)  Outcome: Progressing Pt denies Si at this time

## 2014-10-11 DIAGNOSIS — F1120 Opioid dependence, uncomplicated: Secondary | ICD-10-CM

## 2014-10-11 NOTE — [facility] Group Notes (Signed)
[facility] Group Notes: (Clinical Social Work)   10/11/2014      Type of Therapy:  Group Therapy   Participation Level:  Did Not Attend despite MHT prompting   Mareida Grossman-Orr, LCSW 10/11/2014, 10:54 AM     

## 2014-10-11 NOTE — Progress Notes (Signed)
Patient did attend the evening speaker AA meeting.  

## 2014-10-11 NOTE — Progress Notes (Signed)
D: Pt continues to be very flat and depressed on the unit today. Pt also continues to be very isolative. Pt sated he is feeling tired and does not energy, pt has been in bed most of the time sleeping. Pt did not attend the group, took all his meds as scheduled.  Pt reported that his depression was a 0, his hopelessness was a 0, and that his anxiety was a 0. Pt reported being negative SI/HI, no AH/VH noted. A: 15 min checks continued for patient safety. R: Pts safety maintained.

## 2014-10-11 NOTE — [facility] Group Notes (Signed)
[facility] Group Notes:  (Nursing/MHT/Case Management/Adjunct)  Date:  10/11/2014  Time:  3:51 PM  Type of Therapy:  Psychoeducational Skills  Participation Level:  Did Not Attend  Participation Quality:  DID NOT ATTEND  Affect:  DID NOT ATTEND  Cognitive:  DID NOT ATTEND  Insight:  None  Engagement in Group:  DID NOT ATTEND  Modes of Intervention:  DID NOT ATTEND  Summary of Progress/Problems: Pt did not attend patient self inventory group.   Bethann Punches 10/11/2014, 3:51 PM

## 2014-10-11 NOTE — Progress Notes (Signed)
[facility] MD Progress Note  10/11/2014 4:37 PM Benjamin Sheppard  MRN:  [id number]   Subjective: Patient states that he is feeling much better.  "I am feeling a lot better; I have actually been denying the medicine for withdrawal because I'm not experiencing anymore of the symptoms."   Patient also states that he will be moving in with his mother once he is discharge and she has already moved all of his belonging back in to her house.  Patient states that he has been sleeping without any difficulty and that the medication helps.   Patient is tolerating his medications; attending and participating in group sessions Patient denies suicidal/homicidal ideation, psychosis, and paranoia at this time.    Principal Problem: Severe recurrent major depression without psychotic features Diagnosis:   Patient Active Problem List   Diagnosis Date Noted  . Substance induced mood disorder [F19.94] 10/08/2014  . Severe recurrent major depression without psychotic features [F33.2] 10/08/2014  . Opioid type dependence, continuous [F11.20] 10/08/2014  . UC (ulcerative colitis) [K51.90] 06/03/2012   Total Time spent with patient: 20 minutes   Past Medical History:  Past Medical History  Diagnosis Date  . Chronic diarrhea 07/11/2010  . Rectal bleeding 07/11/2010  . UC (ulcerative colitis) 07/11/2010    Past Surgical History  Procedure Laterality Date  . Sigmoidoscopy  07/2009   Family History:  Family History  Problem Relation Age of Onset  . Healthy Mother   . Heart attack Father   . Healthy Sister    Social History:  History  Alcohol Use No     History  Drug Use  . Yes  . Special: Oxycodone, Marijuana, Benzodiazepines    Social History   Social History  . Marital Status: Single    Spouse Name: N/A  . Number of Children: N/A  . Years of Education: N/A   Social History Main Topics  . Smoking status: Current Every Day Smoker -- 0.50 packs/day    Types: Cigarettes  . Smokeless tobacco: Never  Used  . Alcohol Use: No  . Drug Use: Yes    Special: Oxycodone, Marijuana, Benzodiazepines  . Sexual Activity: Yes    Birth Control/ Protection: Condom   Other Topics Concern  . None   Social History Narrative   Additional History:    Sleep: Good  Appetite:  Good   Assessment:   Musculoskeletal: Strength & Muscle Tone: within normal limits Gait & Station: normal Patient leans: normal   Psychiatric Specialty Exam: Physical Exam  Nursing note and vitals reviewed. Constitutional: He is oriented to person, place, and time.  Neck: Normal range of motion.  Respiratory: Effort normal.  Musculoskeletal: Normal range of motion.  Neurological: He is alert and oriented to person, place, and time.    Review of Systems  Psychiatric/Behavioral: Positive for depression (Improved) and substance abuse. Hallucinations: denies. The patient is nervous/anxious (Improved).   All other systems reviewed and are negative.   Blood pressure 109/53, pulse 103, temperature 97.9 F (36.6 C), temperature source Oral, resp. rate 16, height  (1.803 m), weight 70.308 kg (155 lb), SpO2 99 %.Body mass index is 21.63 kg/(m^2).  General Appearance: Casual  Eye Contact::  Good  Speech:  Clear and Coherent  Volume:  Normal  Mood:  Anxious  Affect:  Congruent  Thought Process:  Coherent and Goal Directed  Orientation:  Full (Time, Place, and Person)  Thought Content:  Denies hallucinations, delusions, and paranoia  Suicidal Thoughts:  No  Homicidal Thoughts:  No  Memory:  Immediate;   Good Recent;   Good Remote;   Good  Judgement:  Intact  Insight:  Present  Psychomotor Activity:  Restlessness  Concentration:  Fair  Recall:  Good  Fund of Knowledge:Good  Language: Good  Akathisia:  No  Handed:  Right  AIMS (if indicated):     Assets:  Desire for Improvement Housing Social Support Talents/Skills Vocational/Educational  ADL's:  Intact  Cognition: WNL  Sleep:  Number of Hours: 5.75      Current Medications: Current Facility-Administered Medications  Medication Dose Route Frequency Provider Last Rate Last Dose  . acetaminophen (TYLENOL) tablet 650 mg  650 mg Oral Q6H PRN Kerry Hough, PA-C   650 mg at 10/10/14 1651  . alum & mag hydroxide-simeth (MAALOX/MYLANTA) 200-200-20 MG/5ML suspension 30 mL  30 mL Oral Q4H PRN Kerry Hough, PA-C      . buPROPion (WELLBUTRIN XL) 24 hr tablet 150 mg  150 mg Oral Daily Rachael Fee, MD   150 mg at 10/11/14 0859  . cloNIDine (CATAPRES) tablet 0.1 mg  0.1 mg Oral BH-qamhs Spencer E Simon, PA-C   0.1 mg at 10/10/14 0840   Followed by  . [START ON 10/12/2014] cloNIDine (CATAPRES) tablet 0.1 mg  0.1 mg Oral QAC breakfast Kerry Hough, PA-C      . dicyclomine (BENTYL) tablet 20 mg  20 mg Oral Q6H PRN Kerry Hough, PA-C      . FLUoxetine (PROZAC) capsule 40 mg  40 mg Oral Daily Kerry Hough, PA-C   40 mg at 10/11/14 1610  . hydrOXYzine (ATARAX/VISTARIL) tablet 25 mg  25 mg Oral TID PRN Kerry Hough, PA-C   25 mg at 10/10/14 2144  . loperamide (IMODIUM) capsule 2-4 mg  2-4 mg Oral PRN Kerry Hough, PA-C      . magnesium hydroxide (MILK OF MAGNESIA) suspension 30 mL  30 mL Oral Daily PRN Kerry Hough, PA-C      . mercaptopurine (PURINETHOL) tablet 50 mg  50 mg Oral QAC breakfast Rachael Fee, MD   50 mg at 10/11/14 0900  . methocarbamol (ROBAXIN) tablet 500 mg  500 mg Oral Q8H PRN Kerry Hough, PA-C      . naproxen (NAPROSYN) tablet 500 mg  500 mg Oral BID PRN Kerry Hough, PA-C      . ondansetron (ZOFRAN-ODT) disintegrating tablet 4 mg  4 mg Oral Q6H PRN Kerry Hough, PA-C      . traZODone (DESYREL) tablet 50 mg  50 mg Oral QHS,MR X 1 Kerry Hough, PA-C   50 mg at 10/10/14 2144    Lab Results:  No results found for this or any previous visit (from the past 48 hour(s)).  Physical Findings: AIMS: Facial and Oral Movements Muscles of Facial Expression: None, normal Lips and Perioral Area: None,  normal Jaw: None, normal Tongue: None, normal,Extremity Movements Upper (arms, wrists, hands, fingers): None, normal Lower (legs, knees, ankles, toes): None, normal, Trunk Movements Neck, shoulders, hips: None, normal, Overall Severity Severity of abnormal movements (highest score from questions above): None, normal Incapacitation due to abnormal movements: None, normal Patient's awareness of abnormal movements (rate only patient's report): No Awareness, Dental Status Current problems with teeth and/or dentures?: No Does patient usually wear dentures?: No  CIWA:  CIWA-Ar Total: 0 COWS:  COWS Total Score: 1  Treatment Plan Summary: Daily contact with patient to assess and evaluate symptoms and progress in treatment  and Medication management   Supportive approach/coping skills Opioid dependence; continue the clonidine detox protocol Work a relapse prevention plan Depression; continue the Prozac 40 mg add Wellbutrin XL 150 mg in AM Work with CBT/mindfulness  Will continue with current treatment plan; no changes at this time.  Possible discharge Monday or Tuesday.  Medical Decision Making:  Established Problem, Stable/Improving (1), Review of Psycho-Social Stressors (1), Review of Last Therapy Session (1) and Review of Medication Regimen & Side Effects (2)   Majesty Oehlert, FNP-BC 10/11/2014, 4:37 PM

## 2014-10-11 NOTE — Plan of Care (Signed)
Problem: Alteration in mood Goal: LTG-Pt's behavior demonstrates decreased signs of depression (Patient's behavior demonstrates decreased signs of depression to the point the patient is safe to return home and continue treatment in an outpatient setting)  Outcome: Progressing Pt stated he is feeling better today.

## 2014-10-12 MED ORDER — BUPROPION HCL ER (XL) 150 MG PO TB24: 150.0000 mg | ORAL_TABLET | Freq: Every day | ORAL | Status: DC

## 2014-10-12 MED ORDER — TRAZODONE HCL 50 MG PO TABS: 50.0000 mg | ORAL_TABLET | Freq: Every evening | ORAL | Status: DC | PRN

## 2014-10-12 MED ORDER — HYDROXYZINE HCL 25 MG PO TABS: 25.0000 mg | ORAL_TABLET | Freq: Three times a day (TID) | ORAL | Status: DC | PRN

## 2014-10-12 MED ORDER — MERCAPTOPURINE 50 MG PO TABS: ORAL_TABLET | ORAL | Status: DC

## 2014-10-12 MED ORDER — FLUOXETINE HCL 40 MG PO CAPS: 40.0000 mg | ORAL_CAPSULE | Freq: Every day | ORAL | Status: DC

## 2014-10-12 NOTE — [facility] Group Notes (Signed)
   [facility] LCSW Aftercare Discharge Planning Group Note  10/12/2014  8:45 AM   Participation Quality: Alert, Appropriate and Oriented  Mood/Affect: Appropriate  Depression Rating: 2-3  Anxiety Rating: 6-7  Thoughts of Suicide: Pt denies SI/HI  Will you contract for safety? Yes  Current AVH: Pt denies  Plan for Discharge/Comments: Pt attended discharge planning group and actively participated in group. CSW provided pt with today's workbook. Patient reports feeling "okay" today. He is hopeful to discharge today. He plans to return home to follow up with outpatient services.   Transportation Means: Pt reports access to transportation  Supports: No supports mentioned at this time  Samuella Bruin, MSW, [employer] Clinical Social Worker [employer] [phone]

## 2014-10-12 NOTE — Progress Notes (Signed)
  [facility] Adult Case Management Discharge Plan :  Will you be returning to the same living situation after discharge:  Yes,  Patient plans to return home At discharge, do you have transportation home?: Yes,  patient reports access to transportation Do you have the ability to pay for your medications: Yes,  patient provided with prescriptions  Release of information consent forms completed and in the chart;  Patient's signature needed at discharge.  Patient to Follow up at: Follow-up Information    Follow up with Dr. Margo Common MD-Medication Management On 10/13/2014.   Why:  Appt at 11:00AM with Dr. Margo Common for hospital follow-up/medication management.    Contact information:   [address] [address], [place] [address] Phone: [phone] Fax: [phone]      Follow up with [facility] On 10/19/2014.   Why:  Therapy appointment with Liam Graham on Monday September 26th at 10 am. Please arrive at 9:30 am with insurance card to complete new patient forms. Call office if you need to reschedule appointment.   Contact information:   [address], [place] [address] Phone: [phone] Fax: [phone]      Patient denies SI/HI: Yes,  denies    Safety Planning and Suicide Prevention discussed: Yes,  with patient   Have you used any form of tobacco in the last 30 days? (Cigarettes, Smokeless Tobacco, Cigars, and/or Pipes): Yes  Has patient been referred to the Quitline?: Patient refused referral  Tyrees Chopin, [place] 10/12/2014, 10:52 AM

## 2014-10-12 NOTE — [facility] Suicide Risk Assessment (Signed)
[facility] Discharge Suicide Risk Assessment   Demographic Factors:  Male and Caucasian  Total Time spent with patient: 30 minutes  Musculoskeletal: Strength & Muscle Tone: within normal limits Gait & Station: normal Patient leans: normal  Psychiatric Specialty Exam: Physical Exam  Review of Systems  Constitutional: Negative.   HENT: Negative.   Eyes: Negative.   Respiratory: Negative.   Cardiovascular: Negative.   Gastrointestinal: Negative.   Genitourinary: Negative.   Musculoskeletal: Negative.   Skin: Negative.   Neurological: Negative.   Endo/Heme/Allergies: Negative.   Psychiatric/Behavioral: Positive for substance abuse.    Blood pressure 119/60, pulse 71, temperature 97.9 F (36.6 C), temperature source Oral, resp. rate 16, height  (1.803 m), weight 70.308 kg (155 lb), SpO2 99 %.Body mass index is 21.63 kg/(m^2).  General Appearance: Fairly Groomed  Patent attorney::  Fair  Speech:  Clear and Coherent409  Volume:  Normal  Mood:  Euthymic  Affect:  Appropriate  Thought Process:  Coherent and Goal Directed  Orientation:  Full (Time, Place, and Person)  Thought Content:  plans as he moves on, relapse prevention plan  Suicidal Thoughts:  No  Homicidal Thoughts:  No  Memory:  Immediate;   Fair Recent;   Fair Remote;   Fair  Judgement:  Fair  Insight:  Present  Psychomotor Activity:  Normal  Concentration:  Fair  Recall:  [employer] of Knowledge:Fair  Language: Fair  Akathisia:  No  Handed:  Right  AIMS (if indicated):     Assets:  Desire for Improvement Housing Social Support Talents/Skills Transportation  Sleep:  Number of Hours: 5.75  Cognition: WNL  ADL's:  Intact   Have you used any form of tobacco in the last 30 days? (Cigarettes, Smokeless Tobacco, Cigars, and/or Pipes): Yes  Has this patient used any form of tobacco in the last 30 days? (Cigarettes, Smokeless Tobacco, Cigars, and/or Pipes) Yes, A prescription for an FDA-approved tobacco cessation  medication was offered at discharge and the patient refused  Mental Status Per Nursing Assessment::   On Admission:  Suicidal ideation indicated by patient, Self-harm thoughts  Current Mental Status by Physician: In full contact with reality. There are no active SI plans or intent. There are no active S/S of withdrawal. He is willing and motivated to pursue outpatient treatment. Will get a medical withdrawal from school this semester get himself together and go back next semester   Loss Factors: NA  Historical Factors: Impulsivity  Risk Reduction Factors:   Sense of responsibility to family, Employed, Living with another person, especially a relative and Positive social support  Continued Clinical Symptoms:  Alcohol/Substance Abuse/Dependencies  Cognitive Features That Contribute To Risk:  None    Suicide Risk:  Minimal: No identifiable suicidal ideation.  Patients presenting with no risk factors but with morbid ruminations; may be classified as minimal risk based on the severity of the depressive symptoms  Principal Problem: Severe recurrent major depression without psychotic features Discharge Diagnoses:  Patient Active Problem List   Diagnosis Date Noted  . Substance induced mood disorder [F19.94] 10/08/2014  . Severe recurrent major depression without psychotic features [F33.2] 10/08/2014  . Opioid type dependence, continuous [F11.20] 10/08/2014  . UC (ulcerative colitis) [K51.90] 06/03/2012    Follow-up Information    Follow up with Dr. Margo Common MD-Medication Management On 10/13/2014.   Why:  Appt at 11:00AM with Dr. Margo Common for hospital follow-up/medication management.    Contact information:   [address], [place] [address] Phone: [phone] Fax: [phone]  Follow up with [facility].   Why:  Referral sent 10/09/14. office closed. please call Monday prior to pt d/c for appt.    Contact information:   [address], [place]  [address] Phone: [phone] Fax: [phone]      Plan Of Care/Follow-up recommendations:  Activity:  as tolerated Diet:  regular Follow up outpatient basis Is patient on multiple antipsychotic therapies at discharge:  No   Has Patient had three or more failed trials of antipsychotic monotherapy by history:  No  Recommended Plan for Multiple Antipsychotic Therapies: NA    LUGO,IRVING A 10/12/2014, 10:25 AM

## 2014-10-12 NOTE — Discharge Summary (Signed)
Physician Discharge Summary Note  Patient:  Benjamin Sheppard is an 24 y.o., male MRN:  [id number] DOB:  04/28/1990 Patient phone:  [phone] (home)  Patient address:   [place] [address],  Total Time spent with patient: Greater than 30 minutes  Date of Admission:  10/08/2014 Date of Discharge: 10-12-14  Reason for Admission: Opioid detox  Principal Problem: Severe recurrent major depression without psychotic features  Discharge Diagnoses: Patient Active Problem List   Diagnosis Date Noted  . Substance induced mood disorder [F19.94] 10/08/2014  . Severe recurrent major depression without psychotic features [F33.2] 10/08/2014  . Opioid type dependence, continuous [F11.20] 10/08/2014  . UC (ulcerative colitis) [K51.90] 06/03/2012   Musculoskeletal: Strength & Muscle Tone: within normal limits Gait & Station: normal Patient leans: N/A  Psychiatric Specialty Exam: Physical Exam  Psychiatric: His speech is normal and behavior is normal. Judgment and thought content normal. His mood appears not anxious. His affect is not angry, not blunt, not labile and not inappropriate. Cognition and memory are normal. He does not exhibit a depressed mood.    Review of Systems  Constitutional: Negative.   HENT: Negative.   Eyes: Negative.   Respiratory: Negative.   Cardiovascular: Negative.   Gastrointestinal: Negative.   Genitourinary: Negative.   Musculoskeletal: Negative.   Skin: Negative.   Endo/Heme/Allergies: Negative.   Psychiatric/Behavioral: Positive for depression (Stable) and substance abuse (Opioid use disorder). Negative for suicidal ideas, hallucinations and memory loss. The patient has insomnia (Stable). The patient is not nervous/anxious.     Blood pressure 119/60, pulse 71, temperature 97.9 F (36.6 C), temperature source Oral, resp. rate 16, height 5' 11"  (1.803 m), weight 70.308 kg (155 lb), SpO2 99 %.Body mass index is 21.63 kg/(m^2).  See Md's SRA   Have you  used any form of tobacco in the last 30 days? (Cigarettes, Smokeless Tobacco, Cigars, and/or Pipes): Yes  Has this patient used any form of tobacco in the last 30 days? (Cigarettes, Smokeless Tobacco, Cigars, and/or Pipes) Yes, A prescription for an FDA-approved tobacco cessation medication was offered at discharge and the patient refused  Past Medical History:  Past Medical History  Diagnosis Date  . Chronic diarrhea 07/11/2010  . Rectal bleeding 07/11/2010  . UC (ulcerative colitis) 07/11/2010    Past Surgical History  Procedure Laterality Date  . Sigmoidoscopy  07/2009   Family History:  Family History  Problem Relation Age of Onset  . Healthy Mother   . Heart attack Father   . Healthy Sister    Social History:  History  Alcohol Use No     History  Drug Use  . Yes  . Special: Oxycodone, Marijuana, Benzodiazepines    Social History   Social History  . Marital Status: Single    Spouse Name: N/A  . Number of Children: N/A  . Years of Education: N/A   Social History Main Topics  . Smoking status: Current Every Day Smoker -- 0.50 packs/day    Types: Cigarettes  . Smokeless tobacco: Never Used  . Alcohol Use: No  . Drug Use: Yes    Special: Oxycodone, Marijuana, Benzodiazepines  . Sexual Activity: Yes    Birth Control/ Protection: Condom   Other Topics Concern  . None   Social History Narrative   Risk to Self: Is patient at risk for suicide?: Yes What has been your use of drugs/alcohol within the last 12 months?: pt reports pain pill abuse for past 3 years. increased significantly in Dec  2015.  Risk to Others: No Prior Inpatient Therapy: Yes Prior Outpatient Therapy: Yes  Level of Care:  OP  Hospital Course:  24 Y/O male who started taking pain pills back in December taking more and more. States he tried to come off. He could not do it. Became very irritable with nausea vomiting aches pains. Using up to 6-7 Percocets ( 60-70 mg) almost every day. States he  has had a lot of depression family deaths bad relatonships. 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 Started the semester at [employer]. The depression is building up again. Was at [employer] took year and a half off due to depression. Tried to go back to [employer]. Started not going to class. Was a straight A student. States he has had stress coming from relationships. Getting cheated on. 3 months ago got out of dysfunctional relationship. Uncles died within 3 years cousin Dx. Leukemia, grandfather died 34 weeks ago Father died 28 years ago random heart attack pull him out of bed died in his arms.  Benjamin Sheppard was admitted to the hospital with his UDS test results showing positive Benzodiazepine & opioid. He did admit having been abusing pills since December, 2015. He was also complaining of worsening symptoms of depression due to ill health & recent multiple deaths of his loved ones. He was requiring mood stabilization treatments. Benjamin Sheppard also is in need of opioid detoxification treatment. He received Clonidine detoxification treatment protocols for his opioid detox. And for mood stabilization treatments, he was medicated & discharged on; Wellbutrin XL 150 mg for depression, Prozac 40 mg for depression, Hydroxyzine 25 mg prn for anxiety & trazodone 50 mg for insomnia. Benjamin Sheppard presented other pre-existing medical issues that required treatments. He was resumed on all his pertinent home medications for those health issues. He tolerated his treatment regimen without any adverse effects or reactions reported.  While a patient in this hospital, Benjamin Sheppard was enrolled & participated in the group counseling sessions being offered & held on this unit. He learned coping skills that should help him cope better after discharge to manage his symptoms to maintain sobriety & mood stability. Benjamin Sheppard's symptoms responded well to his treatment regimen. This is evidenced by his reports of improved mood & absence of substance withdrawal  symptoms. He is currently being discharged to follow-up care as noted below. He was provided with all the pertinent information required to make this appointment without problems.  Upon discharge, Benjamin Sheppard adamantly denies any SIHI, AVH, delusional thoughts, paranoia & or substance withdrawal symptoms. He left [facility] with all personal belongings in no apparent distress. Transportation per his arrangement.  Consults:  psychiatry  Significant Diagnostic Studies:  labs: CBC with diff, CMP, UDS, toxicology tests, U/A, results reviewed, stable  Discharge Vitals:   Blood pressure 119/60, pulse 71, temperature 97.9 F (36.6 C), temperature source Oral, resp. rate 16, height 5' 11"  (1.803 m), weight 70.308 kg (155 lb), SpO2 99 %. Body mass index is 21.63 kg/(m^2). Lab Results:   No results found for this or any previous visit (from the past 72 hour(s)).  Physical Findings: AIMS: Facial and Oral Movements Muscles of Facial Expression: None, normal Lips and Perioral Area: None, normal Jaw: None, normal Tongue: None, normal,Extremity Movements Upper (arms, wrists, hands, fingers): None, normal Lower (legs, knees, ankles, toes): None, normal, Trunk Movements Neck, shoulders, hips: None, normal, Overall Severity Severity of abnormal movements (highest score from questions above): None, normal Incapacitation due to abnormal movements: None, normal Patient's awareness of  abnormal movements (rate only patient's report): No Awareness, Dental Status Current problems with teeth and/or dentures?: No Does patient usually wear dentures?: No  CIWA:  CIWA-Ar Total: 0 COWS:  COWS Total Score: 0  See Psychiatric Specialty Exam and Suicide Risk Assessment completed by Attending Physician prior to discharge.  Discharge destination:  Home  Is patient on multiple antipsychotic therapies at discharge:  No   Has Patient had three or more failed trials of antipsychotic monotherapy by history:  No  Recommended Plan  for Multiple Antipsychotic Therapies: NA    Medication List    TAKE these medications      Indication   buPROPion 150 MG 24 hr tablet  Commonly known as:  WELLBUTRIN XL  Take 1 tablet (150 mg total) by mouth daily. For depression   Indication:  Major Depressive Disorder     FLUoxetine 40 MG capsule  Commonly known as:  PROZAC  Take 1 capsule (40 mg total) by mouth daily. For depression   Indication:  Major Depressive Disorder     hydrOXYzine 25 MG tablet  Commonly known as:  ATARAX/VISTARIL  Take 1 tablet (25 mg total) by mouth 3 (three) times daily as needed for anxiety.   Indication:  Anxiety     mercaptopurine 50 MG tablet  Commonly known as:  PURINETHOL  TAKE ONE TABLET BY MOUTH ON AN EMPTY STOMACH ONE HOUR BEFORE MEALS OR TWO HOURS AFTER MEALS (CAUTION: CHEMOTHERAPY)   Indication:  Chemotherapy     traZODone 50 MG tablet  Commonly known as:  DESYREL  Take 1 tablet (50 mg total) by mouth at bedtime and may repeat dose one time if needed. For sleep   Indication:  Trouble Sleeping       Follow-up Information    Follow up with Dr. Scotty Court MD-Medication Management On 10/13/2014.   Why:  Appt at 11:00AM with Dr. Scotty Court for hospital follow-up/medication management.    Contact information:   [place], [place] [address] Phone: [phone] Fax: [phone]      Follow up with [facility].   Why:  Referral sent 10/09/14. office closed. please call Monday prior to pt d/c for appt.    Contact information:   [place], [place] [address] Phone: [phone] Fax: [phone]     Follow-up recommendations: Activity:  As tolerated Diet: As recommended by your primary care doctor. Keep all scheduled follow-up appointments as recommended.    Comments: Take all your medications as prescribed by your mental healthcare Benjamin Sheppard. Report any adverse effects and or reactions from your medicines to your outpatient Benjamin Sheppard promptly. Patient is  instructed and cautioned to not engage in alcohol and or illegal drug use while on prescription medicines. In the event of worsening symptoms, patient is instructed to call the crisis hotline, 911 and or go to the nearest ED for appropriate evaluation and treatment of symptoms. Follow-up with your primary care Benjamin Sheppard for your other medical issues, concerns and or health care needs.   Total Discharge Time: Greater than 30 minutes  Signed: Encarnacion Slates, PMHNP, FNP-BC 10/12/2014, 10:30 AM  I personally assessed the patient and formulated the plan Geralyn Flash A. Sabra Heck, M.D.

## 2014-10-12 NOTE — Progress Notes (Signed)
D. Pt had been up and visible in milieu this evening, did attend and participate in evening group activity. Pt spoke of his day, spoke about how it was going ok and spoke about feeling better. Pt has not complained of any withdrawal symptoms and did not appear to have any acute withdrawal symptoms this evening. Pt did receive all medications without incident and did not verbalize any complaints. A. Support and encouragement provided. R. Safety maintained, will continue to monitor.

## 2014-10-12 NOTE — Progress Notes (Signed)
Recreation Therapy Notes  Date: 09.19.2016 Time: 9:30am Location: [address] Group Room   Group Topic: Stress Management  Goal Area(s) Addresses:  Patient will actively participate in stress management techniques presented during session.   Behavioral Response: Did not attend.   Ellwyn Ergle L Katena Petitjean, LRT/CTRS  Brindy Higginbotham L 10/12/2014 1:36 PM 

## 2014-10-12 NOTE — Progress Notes (Signed)
Discharge note: Patient discharged home per MD orders.  Patient received all personal belongings and prescriptions.  He denies SI/HI/AVH.  Reviewed discharge instructions, medication administration and follow up information.  He indicated understanding.  Patient left ambulatory with his mother.

## 2014-10-13 ENCOUNTER — Other Ambulatory Visit (INDEPENDENT_AMBULATORY_CARE_PROVIDER_SITE_OTHER): Payer: Self-pay | Admitting: Internal Medicine

## 2014-10-21 ENCOUNTER — Encounter (INDEPENDENT_AMBULATORY_CARE_PROVIDER_SITE_OTHER): Payer: Self-pay | Admitting: Internal Medicine

## 2014-10-21 ENCOUNTER — Ambulatory Visit (INDEPENDENT_AMBULATORY_CARE_PROVIDER_SITE_OTHER): Payer: BLUE CROSS/BLUE SHIELD | Admitting: Internal Medicine

## 2014-10-21 VITALS — BP 110/56 | HR 72 | Temp 97.6°F | Ht 71.0 in | Wt 164.2 lb

## 2014-10-21 DIAGNOSIS — K5120 Ulcerative (chronic) proctitis without complications: Secondary | ICD-10-CM

## 2014-10-21 LAB — CBC WITH DIFFERENTIAL/PLATELET
BASOS ABS: 0.1 10*3/uL (ref 0.0–0.1)
BASOS PCT: 1 % (ref 0–1)
EOS ABS: 0.2 10*3/uL (ref 0.0–0.7)
Eosinophils Relative: 3 % (ref 0–5)
HCT: 41.6 % (ref 39.0–52.0)
Hemoglobin: 14.6 g/dL (ref 13.0–17.0)
Lymphocytes Relative: 24 % (ref 12–46)
Lymphs Abs: 1.7 10*3/uL (ref 0.7–4.0)
MCH: 31.1 pg (ref 26.0–34.0)
MCHC: 35.1 g/dL (ref 30.0–36.0)
MCV: 88.5 fL (ref 78.0–100.0)
MONOS PCT: 7 % (ref 3–12)
MPV: 9.9 fL (ref 8.6–12.4)
Monocytes Absolute: 0.5 10*3/uL (ref 0.1–1.0)
NEUTROS PCT: 65 % (ref 43–77)
Neutro Abs: 4.6 10*3/uL (ref 1.7–7.7)
PLATELETS: 208 10*3/uL (ref 150–400)
RBC: 4.7 MIL/uL (ref 4.22–5.81)
RDW: 14.5 % (ref 11.5–15.5)
WBC: 7.1 10*3/uL (ref 4.0–10.5)

## 2014-10-21 LAB — C-REACTIVE PROTEIN

## 2014-10-21 NOTE — Patient Instructions (Signed)
Labs today. OV in 1 y ear.  

## 2014-10-21 NOTE — Progress Notes (Signed)
   Subjective:    Patient ID: Benjamin Sheppard, male    DOB: [birth year], 24 y.o.   MRN: [id number]  HPI Here today for f/u of his UC. Presently taking for UC. He was last seen in August of 2015. He tells me he is doing good. He has gained 20 pounds since his last visit in August. Appetite is good. There is no abdominal pain. He is having 1-2 stools a day. No melena or BRRB. No rectal pain.  He tries to exercise daily. He is trying to get a job and [place] in [place]. Single, no children,    Flexible sigmoid 2011: Distal coliltis involving the rectum and sigmoid colon with transition at 35 cm from the anal margin. Endoscopic appearance typical of UC.  Review of Systems See hpi Review of Systems Past Medical History  Diagnosis Date  . Chronic diarrhea 07/11/2010  . Rectal bleeding 07/11/2010  . UC (ulcerative colitis) 07/11/2010    Past Surgical History  Procedure Laterality Date  . Sigmoidoscopy  07/2009    No Known Allergies  Current Outpatient Prescriptions on File Prior to Visit  Medication Sig Dispense Refill  . buPROPion (WELLBUTRIN XL) 150 MG 24 hr tablet Take 1 tablet (150 mg total) by mouth daily. For depression 30 tablet 0  . FLUoxetine (PROZAC) 40 MG capsule Take 1 capsule (40 mg total) by mouth daily. For depression 30 capsule 0  . hydrOXYzine (ATARAX/VISTARIL) 25 MG tablet Take 1 tablet (25 mg total) by mouth 3 (three) times daily as needed for anxiety. 45 tablet 0  . mercaptopurine (PURINETHOL) 50 MG tablet TAKE ONE TABLET BY MOUTH ON AN EMPTY STOMACH ONE HOUR BEFORE MEALS OR TWO HOURS AFTER MEALS (CAUTION: CHEMOTHERAPY) 30 tablet 3  . traZODone (DESYREL) 50 MG tablet Take 1 tablet (50 mg total) by mouth at bedtime and may repeat dose one time if needed. For sleep 60 tablet 0   No current facility-administered medications on file prior to visit.        Objective:   Physical Exam Blood pressure 110/56, pulse 72, temperature 97.6 F (36.4 C), height   (1.803 m), weight 164 lb 3.2 oz (74.481 kg). Alert and oriented. Skin warm and dry. Oral mucosa is moist.   . Sclera anicteric, conjunctivae is pink. Thyroid not enlarged. No cervical lymphadenopathy. Lungs clear. Heart regular rate and rhythm.  Abdomen is soft. Bowel sounds are positive. No hepatomegaly. No abdominal masses felt. No tenderness.  No edema to lower extremities.          Assessment & Plan:  UC. He is in remisison. No GI symptoms. Will get a CBC and CRP  OV in 1 year.

## 2015-02-11 ENCOUNTER — Emergency Department (HOSPITAL_COMMUNITY)
Admission: EM | Admit: 2015-02-11 | Discharge: 2015-02-11 | Disposition: A | Discharge status: Home / Self Care | Payer: BLUE CROSS/BLUE SHIELD | Attending: Emergency Medicine | Admitting: Emergency Medicine

## 2015-02-11 ENCOUNTER — Encounter (HOSPITAL_COMMUNITY): Payer: Self-pay | Admitting: *Deleted

## 2015-02-11 DIAGNOSIS — F17210 Nicotine dependence, cigarettes, uncomplicated: Secondary | ICD-10-CM | POA: Diagnosis not present

## 2015-02-11 DIAGNOSIS — B009 Herpesviral infection, unspecified: Secondary | ICD-10-CM | POA: Diagnosis not present

## 2015-02-11 DIAGNOSIS — Z8719 Personal history of other diseases of the digestive system: Secondary | ICD-10-CM | POA: Insufficient documentation

## 2015-02-11 DIAGNOSIS — Z87442 Personal history of urinary calculi: Secondary | ICD-10-CM | POA: Insufficient documentation

## 2015-02-11 DIAGNOSIS — R300 Dysuria: Secondary | ICD-10-CM | POA: Diagnosis present

## 2015-02-11 HISTORY — DX: Calculus of kidney: N20.0

## 2015-02-11 MED ORDER — ACYCLOVIR 400 MG PO TABS: 400.0000 mg | ORAL_TABLET | Freq: Four times a day (QID) | ORAL | Status: DC

## 2015-02-11 NOTE — ED Provider Notes (Signed)
CSN: [id number]     Arrival date & time 02/11/15  1331 History   First MD Initiated Contact with Patient 02/11/15 1346     No chief complaint on file.    (Consider location/radiation/quality/duration/timing/severity/associated sxs/prior Treatment) The history is provided by the patient.   Benjamin Sheppard is a 25 y.o. male presenting with a cold sore in the corner of his right lip which has been present for the past week, prior to this his last outbreak was at least 10 years ago.  Additionally, he has noticed 2 small reddened tender raised lesions on his penis which has been present for the past 3 days.  He has had burning pain with urination but denies penile discharge.  His girlfriend has broke out in what is suspected to be her sentinel flare of genital herpes, and has been started on medication in anticipation of positive test results.  Pt endorses monogamous relationship, they do not always use condoms and has had oral sex. He denies urinary frequency, abdominal pain, nausea, vomiting, fevers.  He has had no treatments prior to arrival.     Past Medical History  Diagnosis Date  . Chronic diarrhea 07/11/2010  . Rectal bleeding 07/11/2010  . UC (ulcerative colitis) (HCC) 07/11/2010  . Kidney stone    Past Surgical History  Procedure Laterality Date  . Sigmoidoscopy  07/2009   Family History  Problem Relation Age of Onset  . Healthy Mother   . Heart attack Father   . Healthy Sister   . Pancreatic cancer Paternal Grandfather    Social History  Substance Use Topics  . Smoking status: Current Every Day Smoker -- 0.50 packs/day    Types: Cigarettes  . Smokeless tobacco: Never Used     Comment: quit 2 1/2 weeks ago  . Alcohol Use: No    Review of Systems  Constitutional: Negative for fever.  HENT: Negative for congestion and sore throat.   Eyes: Negative.   Gastrointestinal: Negative for nausea and abdominal pain.  Genitourinary: Positive for dysuria and genital sores.  Negative for urgency, hematuria, flank pain and discharge.  Musculoskeletal: Negative for joint swelling, arthralgias and neck pain.  Skin: Negative.  Negative for rash and wound.  Neurological: Negative for dizziness, weakness, light-headedness, numbness and headaches.  Psychiatric/Behavioral: Negative.       Allergies  Review of patient's allergies indicates no known allergies.  Home Medications   Prior to Admission medications   Medication Sig Start Date End Date Taking? Authorizing Provider  mercaptopurine (PURINETHOL) 50 MG tablet TAKE ONE TABLET BY MOUTH ON AN EMPTY STOMACH ONE HOUR BEFORE MEALS OR TWO HOURS AFTER MEALS (CAUTION: CHEMOTHERAPY) 10/12/14  Yes Sanjuana Kava, NP  acyclovir (ZOVIRAX) 400 MG tablet Take 1 tablet (400 mg total) by mouth 4 (four) times daily. 02/11/15   Burgess Amor, PA-C  buPROPion (WELLBUTRIN XL) 150 MG 24 hr tablet Take 1 tablet (150 mg total) by mouth daily. For depression Patient not taking: Reported on 02/11/2015 10/12/14   Sanjuana Kava, NP  FLUoxetine (PROZAC) 40 MG capsule Take 1 capsule (40 mg total) by mouth daily. For depression Patient not taking: Reported on 02/11/2015 10/12/14   Sanjuana Kava, NP  hydrOXYzine (ATARAX/VISTARIL) 25 MG tablet Take 1 tablet (25 mg total) by mouth 3 (three) times daily as needed for anxiety. Patient not taking: Reported on 02/11/2015 10/12/14   Sanjuana Kava, NP  traZODone (DESYREL) 50 MG tablet Take 1 tablet (50 mg total) by mouth at bedtime  and may repeat dose one time if needed. For sleep Patient not taking: Reported on 02/11/2015 10/12/14   Sanjuana Kava, NP   BP 124/65 mmHg  Pulse 67  Temp(Src) 97.8 F (36.6 C) (Oral)  Resp 18  Ht  (1.803 m)  Wt 74.844 kg  BMI 23.02 kg/m2  SpO2 100% Physical Exam  Constitutional: He appears well-developed and well-nourished.  HENT:  Head: Normocephalic and atraumatic.  Eyes: Conjunctivae are normal.  Neck: Normal range of motion.  Cardiovascular: Normal rate and  regular rhythm.   Pulmonary/Chest: Effort normal and breath sounds normal.  Abdominal: Soft. There is no tenderness.  Genitourinary: Penis normal.  No penile discharge.  Circumcised. 2 small 0.5 cm slightly raised erythematous lesions on shaft of penis, tiny central intact blister.  Musculoskeletal: Normal range of motion.  Neurological: He is alert.  Skin: Skin is warm and dry.  Psychiatric: He has a normal mood and affect.  Nursing note and vitals reviewed.  Chaperone was present during exam.  ED Course  Procedures (including critical care time) Labs Review Labs Reviewed  RPR  HIV ANTIBODY (ROUTINE TESTING)  GC/CHLAMYDIA PROBE AMP ([place]) NOT AT [facility]    Imaging Review No results found. I have personally reviewed and evaluated these images and lab results as part of my medical decision-making.   EKG Interpretation None      MDM   Final diagnoses:  Herpes simplex    Herpes simplex of labia, possible new herpes lesions on penis as well.  He was placed on acyclovir, discussed transmission and safe sex, advised f/u with pcp prn.  Pt was desirous of std screenings, these labs were pending. Pt aware he will be contacted if any results positive.      Burgess Amor, PA-C 02/12/15 2128  Rolland Porter, MD 02/16/15 [phone]

## 2015-02-11 NOTE — ED Notes (Signed)
Pt with cold sore to bottom lip and two bumps noted to penis 3 days ago, also c/o burning on urination.  States girlfriend had been checked and doctor seemed to think she had herpes but test has not been confirmed

## 2015-02-11 NOTE — Discharge Instructions (Signed)
Herpes Simplex Virus Herpes simplex virus is a viral infection that may infect many different areas of the body, such as the genitalia and mouth. There are two different strains of the virus: herpes simplex virus 1 (HSV-1) and herpes simplex virus 2 (HSV-2). HSV-1 is typically associated with infections of the mouth and lips. HSV-2 is associated with infections of the genitals. However, either strain of the virus may infect any area. HSV may be spread through saliva particles or sexual contact. One unusual form of HSV-1, known as herpes gladiatorum, is passed from skin-to-skin contact, such as in wrestling. SYMPTOMS   Sometimes, no symptoms.  Fever.  Headache.  Muscle aches.  Tingling.  Itching.  Tenderness.  Genital burning feeling.  Genital pain.  Pain with urination.  Pain with sexual intercourse.  Small blisters in the affected areas. RISK FACTORS   Kissing an infected person.  Sharing eating utensils with an infected person.  Unprotected sexual activity.  Multiple sexual partners.  Direct contact sports without protective clothing.  Contact with an exposed herpes sore.  Stress, illness, and cold increase the risk of recurrence. PROGNOSIS  The primary outbreak of an HSV infection usually lasts 2 to 3 weeks. However, it has been known to last up to 6 weeks. After the primary outbreak subsides, the virus goes into a stage known as latency. During this time, there may be no physical symptoms of infection. After a period of time, some event, such as stress, cold, or illness will trigger another outbreak. This cycle of latency and outbreak may continue indefinitely. The outbreaks usually become milder over time. The body cannot rid itself of HSV. RELATED COMPLICATIONS   Recurrence.  Infection in other areas of the body, such as the eye (ocular herpetic infection, keratitis) and rarely the brain (herpetic encephalitis). TREATMENT  Many HSV infections can be treated  without medicine. During an outbreak, avoid touching the sores. Ice may be used to dull the pain and suppress the virus. Exposure to the sun is a common trigger for an outbreak, so the use of sunscreen may help in such cases. Avoid sexual contact during outbreaks. During the latent periods, it is advised that you use latex condoms, which will reduce the likelihood of spreading the virus to another person. Condoms made from animal products do not protect against HSV. Male condoms cover a larger area than male condoms, and may offer the most protection from the transmission of HSV. The presence of HSV will not affect a condom's ability to protect against pregnancy. Only take medicines for pain and discomfort if directed to do so by your caregiver. Many claims exist that certain dietary changes will prevent an outbreak, but these claims have not been proven. These claims include eating foods that are high in L-lysine and low in arginine (i.e. yogurt, beets, apples, pears, mangoes, oily fish (such as salmon, haddock, snapper, and swordfish), soybean sprouts, chicken, and tomatoes).  Athletes may return to play once they are showing no symptoms, and they have been treated.    This information is not intended to replace advice given to you by your health care provider. Make sure you discuss any questions you have with your health care provider.   Document Released: 01/09/2005 Document Revised: 04/03/2011 Document Reviewed: 07/29/2014 Elsevier Interactive Patient Education 2016 [employer]  

## 2015-02-12 LAB — GC/CHLAMYDIA PROBE AMP (~~LOC~~) NOT AT ARMC
CHLAMYDIA, DNA PROBE: NEGATIVE
Neisseria Gonorrhea: NEGATIVE

## 2015-02-12 LAB — RPR: RPR Ser Ql: NONREACTIVE

## 2015-02-12 LAB — HIV ANTIBODY (ROUTINE TESTING W REFLEX): HIV SCREEN 4TH GENERATION: NONREACTIVE

## 2015-06-25 ENCOUNTER — Encounter (INDEPENDENT_AMBULATORY_CARE_PROVIDER_SITE_OTHER): Payer: Self-pay | Admitting: Internal Medicine

## 2015-10-18 ENCOUNTER — Ambulatory Visit (INDEPENDENT_AMBULATORY_CARE_PROVIDER_SITE_OTHER): Payer: Self-pay | Admitting: Internal Medicine

## 2015-10-27 ENCOUNTER — Encounter (INDEPENDENT_AMBULATORY_CARE_PROVIDER_SITE_OTHER): Payer: Self-pay | Admitting: Internal Medicine

## 2015-10-27 ENCOUNTER — Ambulatory Visit (INDEPENDENT_AMBULATORY_CARE_PROVIDER_SITE_OTHER): Payer: BLUE CROSS/BLUE SHIELD | Admitting: Internal Medicine

## 2015-10-27 VITALS — BP 100/60 | HR 60 | Temp 97.9°F | Ht 71.0 in | Wt 176.0 lb

## 2015-10-27 DIAGNOSIS — K5120 Ulcerative (chronic) proctitis without complications: Secondary | ICD-10-CM | POA: Diagnosis not present

## 2015-10-27 NOTE — Progress Notes (Signed)
   Subjective:    Patient ID: Benjamin Sheppard, male    DOB: [birth year], 25 y.o.   MRN: [id number]  HPI Here today for  of his UC. Presently taking 6MP for UC. He was last seen in September of 2016. Diagnosed in 2011.  He tells me he is doing good.  He has a girl friend.  He says if he drinks etoh he will feel bad the next day. He has a BM x 2-3 times a day. Stools are formed and solid. If he drinks etoh or eats popcorn he may see some blood.  Appetite is good. He has gained 12 pounds since his last visit There is no abdominal pain.   . No rectal pain.  He is not exercising.    Single, no children, does have a significant other who is present in the room today.     Flexible sigmoid 2011: Distal coliltis involving the rectum and sigmoid colon with transition at 35 cm from the anal margin. Endoscopic appearance typical of UC.  Review of Systems   Review of Systems Past Medical History:  Diagnosis Date  . Chronic diarrhea 07/11/2010  . Kidney stone   . Rectal bleeding 07/11/2010  . UC (ulcerative colitis) ([place]) 07/11/2010    Past Surgical History:  Procedure Laterality Date  . SIGMOIDOSCOPY  07/2009    No Known Allergies  Current Outpatient Prescriptions on File Prior to Visit  Medication Sig Dispense Refill  . mercaptopurine (PURINETHOL) 50 MG tablet TAKE ONE TABLET BY MOUTH ON AN EMPTY STOMACH ONE HOUR BEFORE MEALS OR TWO HOURS AFTER MEALS (CAUTION: CHEMOTHERAPY) 30 tablet 3   No current facility-administered medications on file prior to visit.        Objective:   Physical Exam Blood pressure 100/60, pulse 60, temperature 97.9 F (36.6 C), height 5' 11"  (1.803 m), weight 176 lb (79.8 kg). Alert and oriented. Skin warm and dry. Oral mucosa is moist.   . Sclera anicteric, conjunctivae is pink. Thyroid not enlarged. No cervical lymphadenopathy. Lungs clear. Heart regular rate and rhythm.  Abdomen is soft. Bowel sounds are positive. No hepatomegaly. No abdominal masses felt. No  tenderness.  No edema to lower extremities.          Assessment & Plan:  UC. He seems to be doing well. Will get a CBC and CRP today OV in 1 year.

## 2015-10-27 NOTE — Patient Instructions (Signed)
UC. He seems to be doing well. Will see back in one year. CBC and CRP today

## 2015-10-28 LAB — CBC WITH DIFFERENTIAL/PLATELET
BASOS PCT: 1 %
Basophils Absolute: 80 cells/uL (ref 0–200)
EOS PCT: 6 %
Eosinophils Absolute: 480 cells/uL (ref 15–500)
HEMATOCRIT: 41 % (ref 38.5–50.0)
HEMOGLOBIN: 13.3 g/dL (ref 13.2–17.1)
LYMPHS ABS: 1440 {cells}/uL (ref 850–3900)
Lymphocytes Relative: 18 %
MCH: 28.4 pg (ref 27.0–33.0)
MCHC: 32.4 g/dL (ref 32.0–36.0)
MCV: 87.4 fL (ref 80.0–100.0)
MONO ABS: 880 {cells}/uL (ref 200–950)
MPV: 9.9 fL (ref 7.5–12.5)
Monocytes Relative: 11 %
NEUTROS ABS: 5120 {cells}/uL (ref 1500–7800)
NEUTROS PCT: 64 %
Platelets: 218 10*3/uL (ref 140–400)
RBC: 4.69 MIL/uL (ref 4.20–5.80)
RDW: 14.1 % (ref 11.0–15.0)
WBC: 8 10*3/uL (ref 3.8–10.8)

## 2015-10-28 LAB — C-REACTIVE PROTEIN: CRP: 2.9 mg/L (ref <8.0)

## 2015-11-15 DIAGNOSIS — Z0289 Encounter for other administrative examinations: Secondary | ICD-10-CM

## 2015-12-09 ENCOUNTER — Other Ambulatory Visit (INDEPENDENT_AMBULATORY_CARE_PROVIDER_SITE_OTHER): Payer: Self-pay | Admitting: Internal Medicine

## 2016-09-21 ENCOUNTER — Encounter (INDEPENDENT_AMBULATORY_CARE_PROVIDER_SITE_OTHER): Payer: Self-pay

## 2016-09-21 ENCOUNTER — Encounter (INDEPENDENT_AMBULATORY_CARE_PROVIDER_SITE_OTHER): Payer: Self-pay | Admitting: Internal Medicine

## 2016-10-26 ENCOUNTER — Ambulatory Visit (INDEPENDENT_AMBULATORY_CARE_PROVIDER_SITE_OTHER): Payer: BLUE CROSS/BLUE SHIELD | Admitting: Internal Medicine

## 2016-10-26 ENCOUNTER — Encounter (INDEPENDENT_AMBULATORY_CARE_PROVIDER_SITE_OTHER): Payer: Self-pay | Admitting: Internal Medicine

## 2016-10-26 VITALS — BP 118/72 | HR 80 | Temp 98.0°F | Ht 71.0 in | Wt 180.3 lb

## 2016-10-26 DIAGNOSIS — K5120 Ulcerative (chronic) proctitis without complications: Secondary | ICD-10-CM

## 2016-10-26 NOTE — Progress Notes (Signed)
   Subjective:    Patient ID: Benjamin Sheppard, male    DOB: [birth year], 26 y.o.   MRN: [id number] Wt in October of 2017 was 176. Today his weight ls 180.4 HPI Here today for f/u of his UC. He was last seen in October of 2017.  Maintained on 6MP for his UC. Diagnosed in 2011.  He tells me he is dong good. He is having 1-2 stools a day. No melena or BRRB. His appetite is good.  He is working full time.      10/27/2015 CRP 2.9   CBC    Component Value Date/Time   WBC 8.0 10/27/2015 1502   RBC 4.69 10/27/2015 1502   HGB 13.3 10/27/2015 1502   HCT 41.0 10/27/2015 1502   PLT 218 10/27/2015 1502   MCV 87.4 10/27/2015 1502   MCH 28.4 10/27/2015 1502   MCHC 32.4 10/27/2015 1502   RDW 14.1 10/27/2015 1502   LYMPHSABS 1,440 10/27/2015 1502   MONOABS 880 10/27/2015 1502   EOSABS 480 10/27/2015 1502   BASOSABS 80 10/27/2015 1502     Flexible sigmoid 2011:Distal coliltis involving the rectum and sigmoid colon with transition at 35 cm from the anal margin. Endoscopic appearance typical of UC.  Review of Systems     Objective:   Physical Exam Blood pressure 118/72, pulse 80, temperature 98 F (36.7 C), height 5' 11"  (1.803 m), weight 180 lb 4.8 oz (81.8 kg). Alert and oriented. Skin warm and dry. Oral mucosa is moist.   . Sclera anicteric, conjunctivae is pink. Thyroid not enlarged. No cervical lymphadenopathy. Lungs clear. Heart regular rate and rhythm.  Abdomen is soft. Bowel sounds are positive. No hepatomegaly. No abdominal masses felt. No tenderness.  No edema to lower extremities.            Assessment & Plan:  UC. He seems to be doing well. Will get a CBC and CRP today. OV in 1 year.

## 2016-10-26 NOTE — Patient Instructions (Signed)
OV in 1 year.  

## 2016-10-27 LAB — CBC WITH DIFFERENTIAL/PLATELET
BASOS ABS: 40 {cells}/uL (ref 0–200)
Basophils Relative: 0.8 %
EOS PCT: 2.2 %
Eosinophils Absolute: 110 cells/uL (ref 15–500)
HEMATOCRIT: 43.1 % (ref 38.5–50.0)
Hemoglobin: 14.5 g/dL (ref 13.2–17.1)
LYMPHS ABS: 1440 {cells}/uL (ref 850–3900)
MCH: 29.8 pg (ref 27.0–33.0)
MCHC: 33.6 g/dL (ref 32.0–36.0)
MCV: 88.5 fL (ref 80.0–100.0)
MONOS PCT: 7.3 %
MPV: 11.1 fL (ref 7.5–12.5)
NEUTROS PCT: 60.9 %
Neutro Abs: 3045 cells/uL (ref 1500–7800)
PLATELETS: 211 10*3/uL (ref 140–400)
RBC: 4.87 10*6/uL (ref 4.20–5.80)
RDW: 12.9 % (ref 11.0–15.0)
TOTAL LYMPHOCYTE: 28.8 %
WBC mixed population: 365 cells/uL (ref 200–950)
WBC: 5 10*3/uL (ref 3.8–10.8)

## 2016-10-27 LAB — HIGH SENSITIVITY CRP: HS-CRP: 0.2 mg/L

## 2016-12-07 ENCOUNTER — Other Ambulatory Visit (INDEPENDENT_AMBULATORY_CARE_PROVIDER_SITE_OTHER): Payer: Self-pay | Admitting: Internal Medicine

## 2017-01-23 HISTORY — PX: WISDOM TOOTH EXTRACTION: SHX21

## 2017-03-19 ENCOUNTER — Telehealth (INDEPENDENT_AMBULATORY_CARE_PROVIDER_SITE_OTHER): Payer: Self-pay | Admitting: *Deleted

## 2017-03-19 NOTE — Telephone Encounter (Signed)
Patient called stating 2 weeks ago and he drink alcohol for one night and the next day he had blood in his stool and he has been taking his medication like he should, patient states no diarrhea but when he goes to the bathroom there is right much blood and mucus. Patient is scheduled to come in on Wednesday at 1:45, I offered patient an appointment tomorrow patient stated his girlfriend is getting out of the hospital tomorrow. Please advise [phone] or [phone]  Patient requested to talk with Terri before the appointment.

## 2017-03-19 NOTE — Telephone Encounter (Signed)
I have talked with patient. He will have OV. Will get a CBC and sedrate

## 2017-03-21 ENCOUNTER — Encounter (INDEPENDENT_AMBULATORY_CARE_PROVIDER_SITE_OTHER): Payer: Self-pay | Admitting: Internal Medicine

## 2017-03-21 ENCOUNTER — Ambulatory Visit (INDEPENDENT_AMBULATORY_CARE_PROVIDER_SITE_OTHER): Payer: BLUE CROSS/BLUE SHIELD | Admitting: Internal Medicine

## 2017-03-21 VITALS — BP 100/70 | HR 68 | Temp 98.0°F | Ht 71.0 in | Wt 180.1 lb

## 2017-03-21 DIAGNOSIS — K5909 Other constipation: Secondary | ICD-10-CM

## 2017-03-21 DIAGNOSIS — K5120 Ulcerative (chronic) proctitis without complications: Secondary | ICD-10-CM

## 2017-03-21 NOTE — Progress Notes (Signed)
   Subjective:    Patient ID: Benjamin Sheppard, male    DOB: 03/18/1990, 27 y.o.   MRN: [id number]  HPI Presents today with c/o rectal bleeding and diarrhea. States he drank etoh about 2 weeks ago.he drank a 75ml of vodka. He has had rectal bleeding since, though today he says he has not had a BM in 2 days. Before that, he was having 2-3 BMs a day and did have blood in them.  He says now he is constipated. Appetite is okay. No weight loss. He has not used a stool softener.      Flexible sigmoid 2011:Distal coliltis involving the rectum and sigmoid colon with transition at 35 cm from the anal margin. Endoscopic appearance typical of UC.     Review of Systems Past Medical History:  Diagnosis Date  . Chronic diarrhea 07/11/2010  . Kidney stone   . Rectal bleeding 07/11/2010  . UC (ulcerative colitis) ([facility]) 07/11/2010    Past Surgical History:  Procedure Laterality Date  . SIGMOIDOSCOPY  07/2009    No Known Allergies  Current Outpatient Medications on File Prior to Visit  Medication Sig Dispense Refill  . mercaptopurine (PURINETHOL) 50 MG tablet TAKE ONE TABLET BY MOUTH ON AN EMPTY STOMACH ONE HOUR BEFORE MEALS OR TWO HOURS AFTER MEALS (CAUTION: CHEMOTHERAPY) 30 tablet 3   No current facility-administered medications on file prior to visit.         Objective:   Physical Exam Blood pressure 100/70, pulse 68, temperature 98 F (36.7 C), height 5\' 11"  (1.803 m), weight 180 lb 1.6 oz (81.7 kg). Alert and oriented. Skin warm and dry. Oral mucosa is moist.   . Sclera anicteric, conjunctivae is pink. Thyroid not enlarged. No cervical lymphadenopathy. Lungs clear. Heart regular rate and rhythm.  Abdomen is soft. Bowel sounds are positive. No hepatomegaly. No abdominal masses felt. No tenderness.  No edema to lower extremities. Stool light brown and guaiac negative.         Assessment & Plan:  UC. No rectyal bleeding in 2 days. Am going to get a CBC and CRP.  Will not start on  any medication till I get labs back. He has not had any rectal bleeding in 2 days.  Now he is constipated. I have advised him to get a stool softener OTC.

## 2017-03-21 NOTE — Patient Instructions (Addendum)
CBC and CRP today Get a stool softener OTC.

## 2017-03-22 LAB — CBC WITH DIFFERENTIAL/PLATELET
BASOS ABS: 48 {cells}/uL (ref 0–200)
Basophils Relative: 0.9 %
EOS ABS: 239 {cells}/uL (ref 15–500)
Eosinophils Relative: 4.5 %
HCT: 41.9 % (ref 38.5–50.0)
HEMOGLOBIN: 14.2 g/dL (ref 13.2–17.1)
Lymphs Abs: 1664 cells/uL (ref 850–3900)
MCH: 30.3 pg (ref 27.0–33.0)
MCHC: 33.9 g/dL (ref 32.0–36.0)
MCV: 89.3 fL (ref 80.0–100.0)
MONOS PCT: 8.7 %
MPV: 11.1 fL (ref 7.5–12.5)
NEUTROS ABS: 2889 {cells}/uL (ref 1500–7800)
Neutrophils Relative %: 54.5 %
Platelets: 234 10*3/uL (ref 140–400)
RBC: 4.69 10*6/uL (ref 4.20–5.80)
RDW: 13.3 % (ref 11.0–15.0)
TOTAL LYMPHOCYTE: 31.4 %
WBC mixed population: 461 cells/uL (ref 200–950)
WBC: 5.3 10*3/uL (ref 3.8–10.8)

## 2017-03-22 LAB — C-REACTIVE PROTEIN: CRP: 0.2 mg/L (ref <8.0)

## 2017-09-10 DIAGNOSIS — M79672 Pain in left foot: Secondary | ICD-10-CM | POA: Diagnosis not present

## 2017-09-10 DIAGNOSIS — B078 Other viral warts: Secondary | ICD-10-CM | POA: Diagnosis not present

## 2017-10-29 ENCOUNTER — Ambulatory Visit (INDEPENDENT_AMBULATORY_CARE_PROVIDER_SITE_OTHER): Payer: 59 | Admitting: Internal Medicine

## 2017-10-29 ENCOUNTER — Encounter (INDEPENDENT_AMBULATORY_CARE_PROVIDER_SITE_OTHER): Payer: Self-pay | Admitting: Internal Medicine

## 2017-10-29 VITALS — BP 130/80 | HR 76 | Temp 98.1°F | Ht 71.0 in | Wt 176.9 lb

## 2017-10-29 DIAGNOSIS — K5120 Ulcerative (chronic) proctitis without complications: Secondary | ICD-10-CM | POA: Diagnosis not present

## 2017-10-29 NOTE — Progress Notes (Signed)
   Subjective:    Patient ID: Benjamin Sheppard, male    DOB: January 06, 1991, 27 y.o.   MRN: [id number]  HPI Here today for f/u. Hx of UC. Last seen in February of 2019.  States he is dong good. Having 1-2 BMS a day. No melena or BRRB. Occasionally sees a spot of BRRB on toilet tissue. Is maintained on Mercaptopurine 50mg .. Is not drinking ETOH. Appetite is good. He has lost from 179 to 176.9.     Flexible sigmoid 2011:Distal coliltis involving the rectum and sigmoid colon with transition at 35 cm from the anal margin. Endoscopic appearance typical of UC.    Review of Systems     Past Medical History:  Diagnosis Date  . Chronic diarrhea 07/11/2010  . Kidney stone   . Rectal bleeding 07/11/2010  . UC (ulcerative colitis) ([facility]) 07/11/2010    Past Surgical History:  Procedure Laterality Date  . SIGMOIDOSCOPY  07/2009    No Known Allergies  Current Outpatient Medications on File Prior to Visit  Medication Sig Dispense Refill  . mercaptopurine (PURINETHOL) 50 MG tablet TAKE ONE TABLET BY MOUTH ON AN EMPTY STOMACH ONE HOUR BEFORE MEALS OR TWO HOURS AFTER MEALS (CAUTION: CHEMOTHERAPY) 30 tablet 3   No current facility-administered medications on file prior to visit.      Objective:   Physical Exam Blood pressure 130/80, pulse 76, temperature 98.1 F (36.7 C), weight 176 lb 14.4 oz (80.2 kg). Alert and oriented. Skin warm and dry. Oral mucosa is moist.   . Sclera anicteric, conjunctivae is pink. Thyroid not enlarged. No cervical lymphadenopathy. Lungs clear. Heart regular rate and rhythm.  Abdomen is soft. Bowel sounds are positive. No hepatomegaly. No abdominal masses felt. No tenderness.  No edema to lower extremities.           Assessment & Plan:  UC. He is doing well. Probably in remission. Will get a CBC and sedrate today.OV in 6 months.

## 2017-11-01 DIAGNOSIS — K5120 Ulcerative (chronic) proctitis without complications: Secondary | ICD-10-CM | POA: Diagnosis not present

## 2017-11-01 LAB — CBC
HCT: 43.9 % (ref 38.5–50.0)
HEMOGLOBIN: 14.9 g/dL (ref 13.2–17.1)
MCH: 29.7 pg (ref 27.0–33.0)
MCHC: 33.9 g/dL (ref 32.0–36.0)
MCV: 87.5 fL (ref 80.0–100.0)
MPV: 11.1 fL (ref 7.5–12.5)
Platelets: 209 10*3/uL (ref 140–400)
RBC: 5.02 10*6/uL (ref 4.20–5.80)
RDW: 13 % (ref 11.0–15.0)
WBC: 6.2 10*3/uL (ref 3.8–10.8)

## 2017-11-01 LAB — SEDIMENTATION RATE: Sed Rate: 2 mm/h (ref 0–15)

## 2017-11-26 ENCOUNTER — Other Ambulatory Visit (INDEPENDENT_AMBULATORY_CARE_PROVIDER_SITE_OTHER): Payer: Self-pay | Admitting: Internal Medicine

## 2017-11-29 DIAGNOSIS — H02402 Unspecified ptosis of left eyelid: Secondary | ICD-10-CM | POA: Diagnosis not present

## 2018-02-22 DIAGNOSIS — R6889 Other general symptoms and signs: Secondary | ICD-10-CM | POA: Diagnosis not present

## 2018-02-22 DIAGNOSIS — J101 Influenza due to other identified influenza virus with other respiratory manifestations: Secondary | ICD-10-CM | POA: Diagnosis not present

## 2018-02-22 DIAGNOSIS — Z6824 Body mass index (BMI) 24.0-24.9, adult: Secondary | ICD-10-CM | POA: Diagnosis not present

## 2018-03-07 DIAGNOSIS — Z6823 Body mass index (BMI) 23.0-23.9, adult: Secondary | ICD-10-CM | POA: Diagnosis not present

## 2018-03-07 DIAGNOSIS — J0101 Acute recurrent maxillary sinusitis: Secondary | ICD-10-CM | POA: Diagnosis not present

## 2018-07-05 ENCOUNTER — Other Ambulatory Visit: Payer: Self-pay

## 2018-07-05 ENCOUNTER — Encounter (HOSPITAL_COMMUNITY): Payer: Self-pay | Admitting: Emergency Medicine

## 2018-07-05 ENCOUNTER — Emergency Department (HOSPITAL_COMMUNITY): Payer: 59

## 2018-07-05 ENCOUNTER — Emergency Department (HOSPITAL_COMMUNITY)
Admission: EM | Admit: 2018-07-05 | Discharge: 2018-07-05 | Disposition: A | Discharge status: Home / Self Care | Payer: 59 | Attending: Emergency Medicine | Admitting: Emergency Medicine

## 2018-07-05 DIAGNOSIS — K51211 Ulcerative (chronic) proctitis with rectal bleeding: Secondary | ICD-10-CM | POA: Diagnosis not present

## 2018-07-05 DIAGNOSIS — Z87891 Personal history of nicotine dependence: Secondary | ICD-10-CM | POA: Insufficient documentation

## 2018-07-05 DIAGNOSIS — K625 Hemorrhage of anus and rectum: Secondary | ICD-10-CM | POA: Diagnosis present

## 2018-07-05 HISTORY — DX: Ulcerative (chronic) proctitis without complications: K51.20

## 2018-07-05 HISTORY — DX: Other psychoactive substance abuse, uncomplicated: F19.10

## 2018-07-05 LAB — CBC WITH DIFFERENTIAL/PLATELET
Abs Immature Granulocytes: 0.01 10*3/uL (ref 0.00–0.07)
Basophils Absolute: 0.1 10*3/uL (ref 0.0–0.1)
Basophils Relative: 1 %
Eosinophils Absolute: 0.3 10*3/uL (ref 0.0–0.5)
Eosinophils Relative: 6 %
HCT: 38.8 % — ABNORMAL LOW (ref 39.0–52.0)
Hemoglobin: 13.4 g/dL (ref 13.0–17.0)
Immature Granulocytes: 0 %
Lymphocytes Relative: 31 %
Lymphs Abs: 1.8 10*3/uL (ref 0.7–4.0)
MCH: 29.8 pg (ref 26.0–34.0)
MCHC: 34.5 g/dL (ref 30.0–36.0)
MCV: 86.4 fL (ref 80.0–100.0)
Monocytes Absolute: 0.5 10*3/uL (ref 0.1–1.0)
Monocytes Relative: 9 %
Neutro Abs: 3.2 10*3/uL (ref 1.7–7.7)
Neutrophils Relative %: 53 %
Platelets: 236 10*3/uL (ref 150–400)
RBC: 4.49 MIL/uL (ref 4.22–5.81)
RDW: 12.7 % (ref 11.5–15.5)
WBC: 5.9 10*3/uL (ref 4.0–10.5)
nRBC: 0 % (ref 0.0–0.2)

## 2018-07-05 LAB — COMPREHENSIVE METABOLIC PANEL
ALT: 18 U/L (ref 0–44)
AST: 20 U/L (ref 15–41)
Albumin: 4.1 g/dL (ref 3.5–5.0)
Alkaline Phosphatase: 67 U/L (ref 38–126)
Anion gap: 10 (ref 5–15)
BUN: 13 mg/dL (ref 6–20)
CO2: 25 mmol/L (ref 22–32)
Calcium: 9 mg/dL (ref 8.9–10.3)
Chloride: 105 mmol/L (ref 98–111)
Creatinine, Ser: 1.13 mg/dL (ref 0.61–1.24)
GFR calc Af Amer: >60 mL/min (ref >60)
GFR calc non Af Amer: >60 mL/min (ref >60)
Glucose, Bld: 96 mg/dL (ref 70–99)
Potassium: 4 mmol/L (ref 3.5–5.1)
Sodium: 140 mmol/L (ref 135–145)
Total Bilirubin: 0.9 mg/dL (ref 0.3–1.2)
Total Protein: 6.9 g/dL (ref 6.5–8.1)

## 2018-07-05 LAB — URINALYSIS, ROUTINE W REFLEX MICROSCOPIC
Bilirubin Urine: NEGATIVE
Glucose, UA: NEGATIVE mg/dL
Hgb urine dipstick: NEGATIVE
Ketones, ur: NEGATIVE mg/dL
Leukocytes,Ua: NEGATIVE
Nitrite: NEGATIVE
Protein, ur: NEGATIVE mg/dL
Specific Gravity, Urine: 1.018 (ref 1.005–1.030)
pH: 8 (ref 5.0–8.0)

## 2018-07-05 LAB — POC OCCULT BLOOD, ED: Fecal Occult Bld: NEGATIVE

## 2018-07-05 LAB — LIPASE, BLOOD: Lipase: 41 U/L (ref 11–51)

## 2018-07-05 MED ORDER — IOHEXOL 300 MG/ML  SOLN
100.0000 mL | Freq: Once | INTRAMUSCULAR | Status: AC | PRN
  Administered 2018-07-05: 100 mL via INTRAVENOUS

## 2018-07-05 MED ORDER — PANTOPRAZOLE SODIUM 40 MG IV SOLR
40.0000 mg | Freq: Once | INTRAVENOUS | Status: AC
  Administered 2018-07-05: 40 mg via INTRAVENOUS
  Filled 2018-07-05: qty 40

## 2018-07-05 MED ORDER — PREDNISONE 10 MG PO TABS: ORAL_TABLET | ORAL | 0 refills | Status: AC

## 2018-07-05 MED ORDER — SODIUM CHLORIDE 0.9 % IV BOLUS
1000.0000 mL | Freq: Once | INTRAVENOUS | Status: AC
  Administered 2018-07-05: 1000 mL via INTRAVENOUS

## 2018-07-05 NOTE — ED Notes (Signed)
PA Henderly performed test.

## 2018-07-05 NOTE — ED Triage Notes (Signed)
Patient reports acute rectal bleeding x 1 month, has become much worse over the past 14 days. Has hx of Crohn's.

## 2018-07-05 NOTE — Discharge Instructions (Signed)
Your evaluated today in the ED for rectal bleeding.  Your CT scan showed possible inflammation around the rectum.  I have given you prescription for prednisone.  Please take as prescribed.  Please follow-up with the GI doctor on Monday.  We have given you a cup for a stool sample.  Please drop this off at the lab so they can run this.  Return to the ED for any new worsening symptoms.

## 2018-07-05 NOTE — ED Provider Notes (Signed)
[facility] EMERGENCY DEPARTMENT Provider Note   CSN: [id number] Arrival date & time: 07/05/18  1730  History   Chief Complaint Chief Complaint  Patient presents with   Rectal Bleeding    HPI Benjamin Sheppard is a 28 y.o. male with past medical history significant for UC, polysubstance abuse, proctitis who presents for evaluation of rectal bleeding. Patient states he has had BRBPR x 1 months however worse over the last 2 weeks.  Denies any abdominal or rectal pain.  Has not followed up with GI. He is being followed by Dr. Ella Jubilee. Has not taken anything for his symptoms. Denies rectal intercourse, ever, chills, nausea, vomiting, headache, cough, chest pain, shortness of breath, abdominal pain, testicular pain, rectal pain, dizziness or lightheadedness.  Patient states he does have generalized fatigue over the last 3 weeks however he has been relating that to a rotating day/night schedule at work.  Denies any history of known COVID 19+ exposures.  Nuys additional aggravating or alleviating factors. Intermittent episodes of diarrhea, however states this is chronic at baseline.  History obtained from patient.  No interpreter is used.    HPI  Past Medical History:  Diagnosis Date   Chronic diarrhea 07/11/2010   Kidney stone    Rectal bleeding 07/11/2010   Substance abuse ([place])    UC (ulcerative colitis) ([place]) 07/11/2010   Ulcerative proctitis [facility])     Patient Active Problem List   Diagnosis Date Noted   Substance induced mood disorder ([place]) 10/08/2014   Severe recurrent major depression without psychotic features ([place]) 10/08/2014   Opioid type dependence, continuous ([place]) 10/08/2014   UC (ulcerative colitis) ([place]) 06/03/2012    Past Surgical History:  Procedure Laterality Date   kidney stone removal     SIGMOIDOSCOPY  07/2009        Home Medications    Prior to Admission medications   Medication Sig Start Date End Date Taking? Authorizing Provider    mercaptopurine (PURINETHOL) 50 MG tablet TAKE ONE TABLET BY MOUTH ON AN EMPTY STOMACH ONE HOUR BEFORE MEALS OR TWO HOURS AFTER MEALS (CAUTION: CHEMOTHERAPY) Patient taking differently: Take 50 mg by mouth daily. TAKE ONE TABLET BY MOUTH ON AN EMPTY STOMACH ONE HOUR BEFORE MEALS OR TWO HOURS AFTER MEALS (CAUTION: CHEMOTHERAPY) 10/12/14  Yes Lindell Spar I, NP  predniSONE (DELTASONE) 10 MG tablet Take 3 tablets (30 mg total) by mouth daily for 7 days, THEN 2.5 tablets (25 mg total) daily for 7 days, THEN 2 tablets (20 mg total) daily for 7 days. 07/05/18 07/26/18  Javien Tesch A, PA-C    Family History Family History  Problem Relation Age of Onset   Healthy Mother    Heart attack Father    Healthy Sister    Pancreatic cancer Paternal Grandfather     Social History Social History   Tobacco Use   Smoking status: Former Smoker    Packs/day: 0.50    Types: Cigarettes   Smokeless tobacco: Never Used   Tobacco comment: quit 2 1/2 weeks ago  Substance Use Topics   Alcohol use: Not Currently    Alcohol/week: 0.0 standard drinks   Drug use: Not Currently    Types: Oxycodone, Marijuana, Benzodiazepines    Comment: denies use 02/11/15     Allergies   Patient has no known allergies.   Review of Systems Review of Systems  Constitutional: Negative.   HENT: Negative.   Respiratory: Negative.   Cardiovascular: Negative.   Gastrointestinal: Positive for blood in stool. Negative  for abdominal distention, abdominal pain, anal bleeding, constipation, diarrhea, nausea, rectal pain and vomiting.  Genitourinary: Negative.   Neurological: Negative.   All other systems reviewed and are negative.    Physical Exam Updated Vital Signs BP 111/72 (BP Location: Right Arm)    Pulse 69    Temp 97.6 F (36.4 C) (Oral)    Resp 16    SpO2 100%   Physical Exam Vitals signs and nursing note reviewed. Exam conducted with a chaperone present.  Constitutional:      General: He is not in acute  distress.    Appearance: He is well-developed. He is not ill-appearing, toxic-appearing or diaphoretic.  HENT:     Head: Atraumatic.     Nose: Nose normal.     Mouth/Throat:     Mouth: Mucous membranes are moist.     Pharynx: Oropharynx is clear.  Eyes:     Pupils: Pupils are equal, round, and reactive to light.  Neck:     Musculoskeletal: Normal range of motion and neck supple.  Cardiovascular:     Rate and Rhythm: Normal rate and regular rhythm.     Pulses: Normal pulses.     Heart sounds: Normal heart sounds. No murmur. No friction rub. No gallop.   Pulmonary:     Effort: Pulmonary effort is normal. No respiratory distress.     Comments: Clear to auscultation bilateral without wheeze, rhonchi rales.  Speak in full sentences without difficulty. Abdominal:     General: Bowel sounds are normal. There is no distension.     Palpations: Abdomen is soft. There is no mass.     Tenderness: There is no abdominal tenderness. There is no right CVA tenderness, left CVA tenderness, guarding or rebound.     Hernia: No hernia is present.  Genitourinary:    Penis: Normal.      Prostate: Normal.     Rectum: Normal.     Comments: No obvious blood. Minimal stool in rectal vault. No internal or external hemorrhoids. No fissure present. No enlarged prostate. Musculoskeletal: Normal range of motion.     Comments: Moves all 4 extremities without difficulty.  No lower extremity edema, erythema, ecchymosis or warmth.  Bilateral calves nontender.  Skin:    General: Skin is warm and dry.     Comments: Brisk capillary refill.  No rashes or lesions.  Neurological:     Mental Status: He is alert.     Comments: Cranial nerves II through XII grossly intact.  No facial droop.  No dysphasia.  Ambulatory in ED without difficulty.    ED Treatments / Results  Labs (all labs ordered are listed, but only abnormal results are displayed) Labs Reviewed  CBC WITH DIFFERENTIAL/PLATELET - Abnormal; Notable for the  following components:      Result Value   HCT 38.8 (*)    All other components within normal limits  URINALYSIS, ROUTINE W REFLEX MICROSCOPIC - Abnormal; Notable for the following components:   APPearance CLOUDY (*)    All other components within normal limits  GASTROINTESTINAL PANEL BY PCR, STOOL (REPLACES STOOL CULTURE)  C DIFFICILE QUICK SCREEN W PCR REFLEX  COMPREHENSIVE METABOLIC PANEL  LIPASE, BLOOD  POC OCCULT BLOOD, ED    EKG None  Radiology Ct Abdomen Pelvis W Contrast  Result Date: 07/05/2018 CLINICAL DATA:  Rectal bleeding for 1 month, worsening of last 14 days. History of Crohn's disease. EXAM: CT ABDOMEN AND PELVIS WITH CONTRAST TECHNIQUE: Multidetector CT imaging of the abdomen and  pelvis was performed using the standard protocol following bolus administration of intravenous contrast. CONTRAST:  166m OMNIPAQUE IOHEXOL 300 MG/ML  SOLN COMPARISON:  Report from 11/18/2010 FINDINGS: Lower chest: Subsegmental atelectasis in the posterior basal segment left lower lobe. Hepatobiliary: Contracted gallbladder, otherwise unremarkable. Pancreas: Unremarkable Spleen: Unremarkable Adrenals/Urinary Tract: Adrenal glands normal. Non rotated left kidney. 3 mm hypodense lesion in the left kidney lower pole on image 54/5 is technically too small to characterize but statistically likely to be benign. Stomach/Bowel: Prominent stool throughout the colon favors constipation. Mildly accentuated perirectal vascularity/stranding raise the possibility of low-grade inflammation. Small perirectal lymph nodes up to 0.6 cm in short axis. Normal appendix. No obvious inflammatory findings in the terminal ileum or cecum. No dilated bowel. Vascular/Lymphatic: Unremarkable Reproductive: Unremarkable Other: No supplemental non-categorized findings. Musculoskeletal: Levoconvex thoracolumbar scoliosis although this has not been present in the past and accordingly is probably positional. SI joints appear unremarkable.  IMPRESSION: 1. The accentuated perirectal vascularity with small lymph nodes in the vicinity raising the possibility of low-grade rectal inflammation. The terminal ileum appears unremarkable. 2.  Prominent stool throughout the colon favors constipation. 3. Incidental non rotated left kidney. Electronically Signed   By: WVan ClinesM.D.   On: 07/05/2018 20:00    Procedures Procedures (including critical care time)  Medications Ordered in ED Medications  sodium chloride 0.9 % bolus 1,000 mL (0 mLs Intravenous Stopped 07/05/18 2045)  pantoprazole (PROTONIX) injection 40 mg (40 mg Intravenous Given 07/05/18 1924)  iohexol (OMNIPAQUE) 300 MG/ML solution 100 mL (100 mLs Intravenous Contrast Given 07/05/18 1933)   Initial Impression / Assessment and Plan / ED Course  I have reviewed the triage vital signs and the nursing notes.  Pertinent labs & imaging results that were available during my care of the patient were reviewed by me and considered in my medical decision making (see chart for details).  28year old male with known history of ulcerative colitis presents for evaluation of rectal bleeding.  Afebrile, nonseptic, non-ill-appearing.  Symptom onset 4 weeks ago however worse over the last 2 weeks.  Patient with multiple episodes of bright red blood per rectum.  Small clots occasionally.  Does have intermittent diarrhea however states this is chronic.  Denies any abdominal pain or emesis.  Denies any rectal intercourse or known history of hemorrhoids.  Abdomen soft, nontender without rebound or guarding.  GU exam with blood.  Heart and lungs clear.  Patient does not appear clinically dehydrated.  Has had generalized fatigue over the last month however has related this to an alternating day/night schedule at work.  No dizziness or lightheadedness.  Nonfocal neuro exam without deficits.  Will obtain labs, CT scan and reassess.  Labs and imaging personally reviewed:  CBC without leukocytosis, Hgb  13.4 CMP without electrolyte, renal or liver abnormalities Lipase 41 Urinalysis negative for infection POC occult negative  CT AP: The accentuated perirectal vascularity with small lymph nodes in the vicinity raising the possibility of low-grade rectal inflammation. The terminal ileum appears unremarkable. 2.  Prominent stool throughout the colon favors constipation.   1955: On reevaluation abdomen soft, nontender without rebound or guarding.  GU exam without obvious blood however limited stool in rectal vault.  No internal or external hemorrhoids.  Given possible low grade rectal inflammation will consult with GI to assess for +/- steroids  2100: Have not heard from GI. Will repage  2115: Consulted with GI, Dr. RLaural Goldenwho thinks likely UC flare. Does not recommend ABX at this time. Recommends Prednisone  59m/dail x 1 week with taper by 510mweek over the next 3 weeks. He will follow up in office on Monday. Will also obtain stool sample to assess for GI pathogens. Patient has not been abel to provide sample in ED. Will send home with cup to obtain outpatient.  Patient is nontoxic, nonseptic appearing, in no apparent distress.  Patient's pain and other symptoms adequately managed in emergency department.  Fluid bolus given.  Labs, imaging and vitals reviewed.  Patient does not meet the SIRS or Sepsis criteria.  On repeat exam patient does not have a surgical abdomin and there are no peritoneal signs.  No indication of appendicitis, bowel obstruction, bowel perforation, cholecystitis, diverticulitis.    The patient has been appropriately medically screened and/or stabilized in the ED. I have low suspicion for any other emergent medical condition which would require further screening, evaluation or treatment in the ED or require inpatient management.  Patient is hemodynamically stable and in no acute distress.  Patient able to ambulate in department prior to ED.  Evaluation does not show acute  pathology that would require ongoing or additional emergent interventions while in the emergency department or further inpatient treatment.  I have discussed the diagnosis with the patient and answered all questions.  Pain is been managed while in the emergency department and patient has no further complaints prior to discharge.  Patient is comfortable with plan discussed in room and is stable for discharge at this time.  I have discussed strict return precautions for returning to the emergency department.  Patient was encouraged to follow-up with PCP/specialist refer to at discharge.     Final Clinical Impressions(s) / ED Diagnoses   Final diagnoses:  [facility]   ED Discharge Orders         Ordered    predniSONE (DELTASONE) 10 MG tablet     07/05/18 2122           Delayne Sanzo A, PA-C 07/05/18 2126    McFrancine GravenDO 07/10/18 1454

## 2018-07-08 ENCOUNTER — Telehealth (INDEPENDENT_AMBULATORY_CARE_PROVIDER_SITE_OTHER): Payer: Self-pay | Admitting: *Deleted

## 2018-07-08 NOTE — Telephone Encounter (Signed)
Per Dr.Rehman the patient was seen in the ED on 06/12. He was given Prednisone, and was to collect a GI Pathogen.  Dr.Rehman states that the patient needs to have OV with Terri Setzer,NP. I have left a message for the patient to call office back to follow up on the GI Pathogen.

## 2018-07-09 ENCOUNTER — Ambulatory Visit (INDEPENDENT_AMBULATORY_CARE_PROVIDER_SITE_OTHER): Payer: 59 | Admitting: Internal Medicine

## 2018-07-15 ENCOUNTER — Other Ambulatory Visit: Payer: Self-pay

## 2018-07-15 ENCOUNTER — Encounter (INDEPENDENT_AMBULATORY_CARE_PROVIDER_SITE_OTHER): Payer: Self-pay | Admitting: Internal Medicine

## 2018-07-15 ENCOUNTER — Ambulatory Visit (INDEPENDENT_AMBULATORY_CARE_PROVIDER_SITE_OTHER): Payer: 59 | Admitting: Internal Medicine

## 2018-07-15 VITALS — BP 144/61 | HR 72 | Temp 98.2°F | Ht 71.0 in | Wt 171.0 lb

## 2018-07-15 DIAGNOSIS — K5120 Ulcerative (chronic) proctitis without complications: Secondary | ICD-10-CM

## 2018-07-15 LAB — SEDIMENTATION RATE: Sed Rate: 2 mm/h (ref 0–15)

## 2018-07-15 NOTE — Patient Instructions (Addendum)
Sedrate. OV in 6 months.  Ulcerative Colitis, Adult  Ulcerative colitis is long-lasting (chronic) swelling (inflammation) of the large intestine (colon). Sores (ulcers) may also form on the colon. Ulcerative colitis is closely related to another condition of inflammation of the intestines that is called Crohn disease. Together, they are frequently referred to as inflammatory bowel disease (IBD). What are the causes? Ulcerative colitis is caused by increased activity of the immune system in the intestines. The immune system is the system that protects the body against harmful bacteria, viruses, fungi, and other things that can make you sick. When the immune system overacts, it causes inflammation. The cause of the increased immune system activity is not known. What increases the risk? Risk factors of ulcerative colitis include:  Age. This includes: ? Being 36-43 years old. ? Being older than 29 years old.  Having a family history of ulcerative colitis.  Being of Jewish descent. What are the signs or symptoms? Common symptoms of ulcerative colitis include rectal bleeding and diarrhea. There is a wide range of symptoms, and a person's symptoms depend on how severe the condition is. Additional symptoms may include:  Pain or cramping in the belly (abdomen).  Fever.  Fatigue.  Weight loss.  Night sweats.  Rectal pain.  Feeling the immediate need to have a bowel movement.  Nausea.  Loss of appetite.  Anemia.  Joint pain or soreness.  Eye irritation.  Certain skin rashes. How is this diagnosed? Ulcerative colitis may be diagnosed by:  Medical history and physical exam.  Blood tests and stool tests.  X-rays.  CT scans.  Colonoscopy. For this test, a flexible tube is inserted into your anus and your colon is examined.  Examination of a tissue sample from your colon (biopsy). How is this treated? Treatment for ulcerative colitis may include medicines to:  Decrease  inflammation.  Control your immune system. Surgery may also be necessary. Follow these instructions at home: Medicines and vitamins  Take medicines only as directed by your doctor. Do not take aspirin.  Ask your doctor if you should take any vitamins or supplements. Lifestyle  Exercise regularly.  Limit alcohol intake to no more than 1 drink per day for nonpregnant women and 2 drinks per day for men. One drink equals 12 ounces of beer, 5 ounces of wine, or 1 ounces of hard liquor. Eating and drinking  Drink enough fluid to keep your urine clear or pale yellow.  Ask your health care provider about the best diet for you. Follow the diet as directed by your health care provider. This may include: ? Avoiding carbonated drinks. ? Avoiding popcorn, vegetable skins, nuts, and other high-fiber foods when you have symptoms of ulcerative colitis. ? Eating smaller meals more often. ? Keeping a food diary. This may help you to find and avoid any foods that make you feel not well.  Limit your caffeine intake. General instructions  Keep all follow-up appointments as directed by your health care provider. This is important. Contact a health care provider if:  Your symptoms do not improve or get worse with treatment.  You continue to lose weight.  You have constant cramps or loose bowels.  You develop a new skin rash, skin sores, or eye problems.  You have a fever or chills. Get help right away if:  You have bloody diarrhea.  You have severe pain in your abdomen.  You vomit. This information is not intended to replace advice given to you by your health care  provider. Make sure you discuss any questions you have with your health care provider. Document Released: 10/19/2004 Document Revised: 09/12/2015 Document Reviewed: 05/04/2014 Elsevier Interactive Patient Education  [employer].

## 2018-07-15 NOTE — Progress Notes (Signed)
Subjective:    Patient ID: Benjamin Sheppard, male    DOB: 04-10-1990, 28 y.o.   MRN: [id number]  HPI  Here today for f/u. Hx of UC. Seen in the ED 07/05/2018 for rectal bleeding. Guaiac negative in the ED. He underwent a CT abdomen pelvis with CM 07/05/2018 which revealed: 1. The accentuated perirectal vascularity with small lymph nodes in the vicinity raising the possibility of low-grade rectal inflammation. The terminal ileum appears unremarkable. 2.  Prominent stool throughout the colon favors constipation. 3. Incidental non rotated left kidney. CBC was stable. Presently taking Prednisone for possible UC flare. He states he is better.  About 4 months ago he drank etoh and his stomach became upset. About 2 weeks ago he started having blood in his stool. States he has a small amt of blood in his stool. He has diarrhea and off. Yesterday he had a little bit of diarrhea. His stool this am, his stool was normal. Appetite is okay. No weight loss.  He has no abdominal pain or cramping. He has some gas. He feels 70% better.  CBC    Component Value Date/Time   WBC 5.9 07/05/2018 1837   RBC 4.49 07/05/2018 1837   HGB 13.4 07/05/2018 1837   HCT 38.8 (L) 07/05/2018 1837   PLT 236 07/05/2018 1837   MCV 86.4 07/05/2018 1837   MCH 29.8 07/05/2018 1837   MCHC 34.5 07/05/2018 1837   RDW 12.7 07/05/2018 1837   LYMPHSABS 1.8 07/05/2018 1837   MONOABS 0.5 07/05/2018 1837   EOSABS 0.3 07/05/2018 1837   BASOSABS 0.1 07/05/2018 1837   Flexible sigmoid 2011: Distal coliltis involving the rectum and sigmoid colon with transition at 35 cm from the anal margin. Endoscopic appearance typical of UC.    Review of Systems Past Medical History:  Diagnosis Date  . Chronic diarrhea 07/11/2010  . Kidney stone   . Rectal bleeding 07/11/2010  . Substance abuse (HCC)   . UC (ulcerative colitis) (HCC) 07/11/2010  . Ulcerative proctitis (HCC)     Past Surgical History:  Procedure Laterality Date  .  kidney stone removal    . SIGMOIDOSCOPY  07/2009    No Known Allergies  Current Outpatient Medications on File Prior to Visit  Medication Sig Dispense Refill  . mercaptopurine (PURINETHOL) 50 MG tablet TAKE ONE TABLET BY MOUTH ON AN EMPTY STOMACH ONE HOUR BEFORE MEALS OR TWO HOURS AFTER MEALS (CAUTION: CHEMOTHERAPY) (Patient taking differently: Take 50 mg by mouth daily. TAKE ONE TABLET BY MOUTH ON AN EMPTY STOMACH ONE HOUR BEFORE MEALS OR TWO HOURS AFTER MEALS (CAUTION: CHEMOTHERAPY)) 30 tablet 3  . predniSONE (DELTASONE) 10 MG tablet Take 3 tablets (30 mg total) by mouth daily for 7 days, THEN 2.5 tablets (25 mg total) daily for 7 days, THEN 2 tablets (20 mg total) daily for 7 days. 15 tablet 0   No current facility-administered medications on file prior to visit.         Objective:   Physical Exam Blood pressure (!) 144/61, pulse 72, temperature 98.2 F (36.8 C), height 5\' 11"  (1.803 m), weight 171 lb (77.6 kg). Alert and oriented. Skin warm and dry. Oral mucosa is moist.   . Sclera anicteric, conjunctivae is pink. Thyroid not enlarged. No cervical lymphadenopathy. Lungs clear. Heart regular rate and rhythm.  Abdomen is soft. Bowel sounds are positive. No hepatomegaly. No abdominal masses felt. No tenderness.  No edema to lower extremities.  Assessment & Plan:  UC. Continue the Prednisone. Will get a sedrate today. Further recommendations to follow. No drinking.

## 2018-10-30 ENCOUNTER — Ambulatory Visit (INDEPENDENT_AMBULATORY_CARE_PROVIDER_SITE_OTHER): Payer: 59 | Admitting: Nurse Practitioner

## 2018-10-30 ENCOUNTER — Encounter (INDEPENDENT_AMBULATORY_CARE_PROVIDER_SITE_OTHER): Payer: Self-pay | Admitting: Nurse Practitioner

## 2018-10-30 NOTE — Progress Notes (Deleted)
   Subjective:    Patient ID: Sandip Power Oliphant, male    DOB: February 09, 1990, 28 y.o.   MRN: [id number]  HPI Brannen Koppen Armitage is a 28 year old male with a past medical history of ulcerative colitis followed by Deberah Castle NP. He was last seen in the office following an ER evaluation for bloody diarrhea. He was treated with Prednisone for a UC flare. He was seen in the office by Deberah Castle on 07/15/2018, a Sed rate was ordered and he completed the Prednisone taper.        Flexible sigmoid 2011:Distal coliltis involving the rectum and sigmoid colon with transition at 35 cm from the anal margin. Endoscopic appearance typical of UC.   He last saw Dr. Laural Golden in 2012. Previously on oral Mesalamine, Prednisone then started 6MP end of July early August 2012.     Past Medical History:  Diagnosis Date  . Chronic diarrhea 07/11/2010  . Kidney stone   . Rectal bleeding 07/11/2010  . Substance abuse ([place])   . UC (ulcerative colitis) ([place]) 07/11/2010  . Ulcerative proctitis ([place])        Review of Systems     Objective:   Physical Exam        Assessment & Plan:

## 2018-10-31 ENCOUNTER — Telehealth (INDEPENDENT_AMBULATORY_CARE_PROVIDER_SITE_OTHER): Payer: Self-pay | Admitting: Nurse Practitioner

## 2018-10-31 NOTE — Telephone Encounter (Signed)
Mitzie please call the patient and send him a letter requesting that he reschedule an office appointment. He is due for a colonoscopy. thx

## 2019-01-14 ENCOUNTER — Ambulatory Visit (INDEPENDENT_AMBULATORY_CARE_PROVIDER_SITE_OTHER): Payer: 59 | Admitting: Internal Medicine

## 2019-03-31 ENCOUNTER — Encounter (HOSPITAL_COMMUNITY): Payer: Self-pay | Admitting: Emergency Medicine

## 2019-03-31 ENCOUNTER — Emergency Department (HOSPITAL_COMMUNITY)
Admission: EM | Admit: 2019-03-31 | Discharge: 2019-03-31 | Disposition: A | Discharge status: Home / Self Care | Payer: 59 | Attending: Emergency Medicine | Admitting: Emergency Medicine

## 2019-03-31 ENCOUNTER — Other Ambulatory Visit: Payer: Self-pay

## 2019-03-31 DIAGNOSIS — M533 Sacrococcygeal disorders, not elsewhere classified: Secondary | ICD-10-CM | POA: Diagnosis present

## 2019-03-31 DIAGNOSIS — K611 Rectal abscess: Secondary | ICD-10-CM | POA: Diagnosis not present

## 2019-03-31 DIAGNOSIS — Z87891 Personal history of nicotine dependence: Secondary | ICD-10-CM | POA: Diagnosis not present

## 2019-03-31 MED ORDER — DOXYCYCLINE HYCLATE 100 MG PO CAPS: 100.0000 mg | ORAL_CAPSULE | Freq: Two times a day (BID) | ORAL | 0 refills | Status: DC

## 2019-03-31 MED ORDER — MORPHINE SULFATE 15 MG PO TABS: 7.5000 mg | ORAL_TABLET | ORAL | 0 refills | Status: DC | PRN

## 2019-03-31 MED ORDER — LIDOCAINE-EPINEPHRINE (PF) 2 %-1:200000 IJ SOLN
10.0000 mL | Freq: Once | INTRAMUSCULAR | Status: AC
  Administered 2019-03-31: 10 mL via INTRADERMAL
  Filled 2019-03-31: qty 10

## 2019-03-31 MED ORDER — ACETAMINOPHEN 500 MG PO TABS
1000.0000 mg | ORAL_TABLET | Freq: Once | ORAL | Status: AC
  Administered 2019-03-31: 1000 mg via ORAL
  Filled 2019-03-31: qty 2

## 2019-03-31 MED ORDER — KETOROLAC TROMETHAMINE 15 MG/ML IJ SOLN
15.0000 mg | Freq: Once | INTRAMUSCULAR | Status: AC
  Administered 2019-03-31: 15 mg via INTRAMUSCULAR
  Filled 2019-03-31: qty 1

## 2019-03-31 NOTE — ED Provider Notes (Signed)
[facility] DEPT Provider Note   CSN: [id number] Arrival date & time: 03/31/19  4097     History Chief Complaint  Patient presents with  . Abscess  . Tailbone Pain    Benjamin Sheppard is a 29 y.o. male.  29 yo M with a chief complaints of a perirectal abscess.  Going on for couple days.  Went to family doctor who wanted to just observe this.  Felt it worsened over 48 hours.  Having a small amount of drainage.  He is worried it could be infected.  Much more painful last night.  The history is provided by the patient.  Abscess Location:  Pelvis Pelvic abscess location:  Gluteal cleft Size:  [place] Abscess quality: fluctuance, painful and redness   Red streaking: no   Duration:  4 days Progression:  Worsening Pain details:    Quality:  Sharp and shooting   Severity:  Moderate   Duration:  4 days   Timing:  Constant   Progression:  Worsening Chronicity:  New Relieved by:  Nothing Worsened by:  Nothing Ineffective treatments:  None tried Associated symptoms: nausea   Associated symptoms: no fever, no headaches and no vomiting        Past Medical History:  Diagnosis Date  . Chronic diarrhea 07/11/2010  . Kidney stone   . Rectal bleeding 07/11/2010  . Substance abuse ([place])   . UC (ulcerative colitis) ([place]) 07/11/2010  . Ulcerative proctitis [facility])     Patient Active Problem List   Diagnosis Date Noted  . Substance induced mood disorder ([place]) 10/08/2014  . Severe recurrent major depression without psychotic features ([place]) 10/08/2014  . Opioid type dependence, continuous ([place]) 10/08/2014  . UC (ulcerative colitis) ([place]) 06/03/2012    Past Surgical History:  Procedure Laterality Date  . kidney stone removal    . SIGMOIDOSCOPY  07/2009       Family History  Problem Relation Age of Onset  . Healthy Mother   . Heart attack Father   . Healthy Sister   . Pancreatic cancer Paternal Grandfather     Social History   Tobacco Use    . Smoking status: Former Smoker    Packs/day: 0.50    Types: Cigarettes  . Smokeless tobacco: Never Used  . Tobacco comment: quit 2 1/2 weeks ago  Substance Use Topics  . Alcohol use: Not Currently    Alcohol/week: 0.0 standard drinks  . Drug use: Not Currently    Types: Oxycodone, Marijuana, Benzodiazepines    Comment: denies use 02/11/15    Home Medications Prior to Admission medications   Medication Sig Start Date End Date Taking? Authorizing Provider  doxycycline (VIBRAMYCIN) 100 MG capsule Take 1 capsule (100 mg total) by mouth 2 (two) times daily. One po bid x 7 days 03/31/19   Deno Etienne, DO  mercaptopurine (PURINETHOL) 50 MG tablet TAKE ONE TABLET BY MOUTH ON AN EMPTY STOMACH ONE HOUR BEFORE MEALS OR TWO HOURS AFTER MEALS (CAUTION: CHEMOTHERAPY) Patient not taking: Reported on 03/31/2019 10/12/14   Lindell Spar I, NP  morphine (MSIR) 15 MG tablet Take 0.5 tablets (7.5 mg total) by mouth every 4 (four) hours as needed for severe pain. 03/31/19   Deno Etienne, DO    Allergies    Patient has no known allergies.  Review of Systems   Review of Systems  Constitutional: Negative for chills and fever.  HENT: Negative for congestion and facial swelling.   Eyes: Negative for discharge and visual  disturbance.  Respiratory: Negative for shortness of breath.   Cardiovascular: Negative for chest pain and palpitations.  Gastrointestinal: Positive for nausea. Negative for abdominal pain, diarrhea and vomiting.  Musculoskeletal: Negative for arthralgias and myalgias.  Skin: Positive for color change. Negative for rash.       And tailbone pain  Neurological: Negative for tremors, syncope and headaches.  Psychiatric/Behavioral: Negative for confusion and dysphoric mood.    Physical Exam Updated Vital Signs BP 133/77   Pulse 85   Temp 98.1 F (36.7 C) (Oral)   Resp 18   Ht 5' 11"  (1.803 m)   Wt 70.3 kg   SpO2 96%   BMI 21.62 kg/m   Physical Exam Vitals and nursing note reviewed.   Constitutional:      Appearance: He is well-developed.  HENT:     Head: Normocephalic and atraumatic.  Eyes:     Pupils: Pupils are equal, round, and reactive to light.  Neck:     Vascular: No JVD.  Cardiovascular:     Rate and Rhythm: Normal rate and regular rhythm.     Heart sounds: No murmur. No friction rub. No gallop.   Pulmonary:     Effort: No respiratory distress.     Breath sounds: No wheezing.  Abdominal:     General: There is no distension.     Tenderness: There is no abdominal tenderness. There is no guarding or rebound.  Genitourinary:    Comments: Fluctuant area just to the left buttock just lateral to the coccyx.  Does not extend to the rectum.  Small punctate area of purulent drainage.  Erythematous mildly warm. Musculoskeletal:        General: Normal range of motion.     Cervical back: Normal range of motion and neck supple.  Skin:    Coloration: Skin is not pale.     Findings: No rash.  Neurological:     Mental Status: He is alert and oriented to person, place, and time.  Psychiatric:        Behavior: Behavior normal.     ED Results / Procedures / Treatments   Labs (all labs ordered are listed, but only abnormal results are displayed) Labs Reviewed - No data to display  EKG None  Radiology No results found.  Procedures .Marland KitchenIncision and Drainage  Date/Time: 03/31/2019 11:04 AM Performed by: Deno Etienne, DO Authorized by: Deno Etienne, DO   Consent:    Consent obtained:  Verbal   Consent given by:  Patient   Risks discussed:  Bleeding, incomplete drainage, damage to other organs and infection   Alternatives discussed:  No treatment, delayed treatment and alternative treatment Location:    Type:  Abscess   Location:  Anogenital   Anogenital location:  Perirectal Pre-procedure details:    Skin preparation:  Chloraprep Anesthesia (see MAR for exact dosages):    Anesthesia method:  Local infiltration   Local anesthetic:  Lidocaine 2% WITH  epi Procedure type:    Complexity:  Complex Procedure details:    Needle aspiration: no     Incision types:  Cruciate   Incision depth:  Subcutaneous   Scalpel blade:  11   Wound management:  Probed and deloculated   Drainage:  Bloody and purulent   Drainage amount:  Moderate   Wound treatment:  Wound left open   Packing materials:  None Post-procedure details:    Patient tolerance of procedure:  Tolerated well, no immediate complications Comments:     Incision was  made at the area of most fluctuance.  There is no appreciable purulence this was extended in a cruciate manner more medially until there was purulence expressed.     (including critical care time)  Medications Ordered in ED Medications  acetaminophen (TYLENOL) tablet 1,000 mg (1,000 mg Oral Given 03/31/19 1029)  ketorolac (TORADOL) 15 MG/ML injection 15 mg (15 mg Intramuscular Given 03/31/19 1030)  lidocaine-EPINEPHrine (XYLOCAINE W/EPI) 2 %-1:200000 (PF) injection 10 mL (10 mLs Intradermal Given 03/31/19 1031)    ED Course  I have reviewed the triage vital signs and the nursing notes.  Pertinent labs & imaging results that were available during my care of the patient were reviewed by me and considered in my medical decision making (see chart for details).    MDM Rules/Calculators/A&P                      29 yo M with a perirectal abscess.  Sparing the rectum.  No systemic symptoms.  Will I&D at bedside.  We will discharge patient home.  As he has a history of ulcerative colitis we will have him discuss with his gastroenterologist.   11:09 AM:  I have discussed the diagnosis/risks/treatment options with the patient and believe the pt to be eligible for discharge home to follow-up with PCP. We also discussed returning to the ED immediately if new or worsening sx occur. We discussed the sx which are most concerning (e.g., sudden worsening pain, fever, inability to tolerate by mouth) that necessitate immediate return.  Medications administered to the patient during their visit and any new prescriptions provided to the patient are listed below.  Medications given during this visit Medications  acetaminophen (TYLENOL) tablet 1,000 mg (1,000 mg Oral Given 03/31/19 1029)  ketorolac (TORADOL) 15 MG/ML injection 15 mg (15 mg Intramuscular Given 03/31/19 1030)  lidocaine-EPINEPHrine (XYLOCAINE W/EPI) 2 %-1:200000 (PF) injection 10 mL (10 mLs Intradermal Given 03/31/19 1031)     The patient appears reasonably screen and/or stabilized for discharge and I doubt any other medical condition or other [facility] requiring further screening, evaluation, or treatment in the ED at this time prior to discharge.   Final Clinical Impression(s) / ED Diagnoses Final diagnoses:  Perirectal abscess    Rx / DC Orders ED Discharge Orders         Ordered    doxycycline (VIBRAMYCIN) 100 MG capsule  2 times daily     03/31/19 1108    morphine (MSIR) 15 MG tablet  Every 4 hours PRN     03/31/19 1108           Deno Etienne, DO 03/31/19 1110

## 2019-03-31 NOTE — ED Triage Notes (Signed)
Pt seen Saturday for abscess/cyst to tailbone; worsening pain since.

## 2019-03-31 NOTE — Discharge Instructions (Signed)
Take 4 over the counter ibuprofen tablets 3 times a day or 2 over-the-counter naproxen tablets twice a day for pain. Also take tylenol 1046m(2 extra strength) four times a day.    Return for worsening symptoms, rapid spreading redness or fever.  Please have someone check this in 48 hours.  Warm compresses or soaks at least 4 times a day.

## 2019-04-21 NOTE — Progress Notes (Signed)
04/21/2019 Benjamin Sheppard [id number] Oct 23, 1990   CHIEF COMPLAINT: Ulcerative colitis, perianal abscess follow up  HISTORY OF PRESENT ILLNESS:  Benjamin Sheppard is a 29 year old male with medical  history of past polysubstance abuse, kidney stones and  ulcerative colitis initially diagnosed by a flexible sigmoidoscopy done by Dr. Laural Golden in 2011. He was prescribed 6MP 66m daily which he tolerated well. His colitis was well controlled. However, he had mild vague stomach upset and he questioned if the 6MP was causing it. He elected to stop 6MP 11/2018 and his stomach upset abated within one week. He denies having any upper or lower abdominal pain. He is passing a normal brown formed stool most days. Infrequent diarrhea occurs if he eats dairy products or red meat. .Marland KitchenHe was last seen by TDeberah CastleNP at Dr. ROlevia [facility]ffice on 10/29/2017. At that time his UC was well controlled with infrequent bright red blood on the toilet tissue. [facility] bleeding x 1 month. Hg 13.4. Rectal exam in the ED did not show any evidence of a fissure of fistula. An abd/pelvic CT showed accentuated perirectal vascularity with small lymph nodes in the vicinity raising the possibility of low-grade rectal inflammation and prominent stool throughout the colon was noted. He was prescribed Prednisone 313mtaper to reduce Prednisone by 37m70mvery week over the next 3 weeks. A follow up appointment was scheduled at Dr. RehOlevia Perchesfice but he no showed.  [facility] with complaints of perianal pain. He was diagnosed with a perirectal abscess s/p I & D. He was prescribed Doxycycline 100m20md x 10 days. He stated seeing his PCP or a general surgeon  approximately 1 week ago who assessed his perianal abscess was nearly resolved. He denies having any further significant anorectal pain. If he sits for too long he has some discomfort. No anorectal discharge or bleeding. No fever.   Infrequent NSAID use for back pain. No family history of IBD.   [place] he wishes to transition his GI management to our office.   Abdominal/pelvic CT with contrast 07/05/2018: 1. The accentuated perirectal vascularity with small lymph nodes in the vicinity raising the possibility of low-grade rectal inflammation. The terminal ileum appears unremarkable. 2.  Prominent stool throughout the colon favors constipation. 3. Incidental non rotated left kidney.  Flexible sigmoidoscopy by Dr. RehmLaural Golden1: Distal coliltis involving the rectum and sigmoid colon with transition at 35 cm from the anal margin. Endoscopic appearance typical of UC.  Past Medical History:  Diagnosis Date  . Chronic diarrhea 07/11/2010  . Kidney stone   . Rectal bleeding 07/11/2010  . Substance abuse (HCC)[place]. UC (ulcerative colitis) (HCC)[place]/18/2012  [facility] Past Surgical History:  Procedure Laterality Date  . kidney stone removal    . SIGMOIDOSCOPY  07/2009    Social History: He is a Lab [employer] drinks Vodka twice monthly, 1/2 pint max twice monthly or less. Rarely drinks anymore. Prior oxycodone, marijuana and benzodiazepine use. No recent drug use. Quit smoking 12/2018.   Family History: No IBD. Mother 50 h35lthy. Father died age MI 50. 50ster with pancreatitis, alcohol abuse. Paternal grandfather with pancreatic cancer.    Outpatient Encounter Medications as of 04/22/2019  Medication Sig  . doxycycline (VIBRAMYCIN) 100 MG capsule Take 1 capsule (100 mg total) by mouth 2 (two) times daily. One po bid  x 7 days  . mercaptopurine (PURINETHOL) 50 MG tablet TAKE ONE TABLET BY MOUTH ON AN EMPTY STOMACH ONE HOUR BEFORE MEALS OR TWO HOURS AFTER MEALS (CAUTION: CHEMOTHERAPY) (Patient not taking: Reported on 03/31/2019)  . morphine (MSIR) 15 MG tablet Take 0.5 tablets (7.5 mg total) by mouth every 4 (four) hours as needed for severe pain.   No facility-administered  encounter medications on file as of 04/22/2019.     REVIEW OF SYSTEMS: All other systems reviewed and negative except where noted in the History of Present Illness.  PHYSICAL EXAM: BP (!) 86/62   Pulse 75   Temp 98.3 F (36.8 C)   Ht 5' 11"  (1.803 m)   Wt 158 lb (71.7 kg)   SpO2 96%   BMI 22.04 kg/m   General: Well developed  no acute distress. Head: Normocephalic and atraumatic. Eyes:  Sclerae non-icteric, conjunctive pink. Ears: Normal auditory acuity. Mouth: Dentition intact. No ulcers or lesions.  Neck: Supple, no lymphadenopathy or thyromegaly.  Lungs: Clear bilaterally to auscultation without wheezes, crackles or rhonchi. Heart: Regular rate and rhythm. No murmur, rub or gallop appreciated.  Abdomen: Soft, nontender, non distended. No masses. No hepatosplenomegaly. Normoactive bowel sounds x 4 quadrants.  Rectal: Deferred.  Musculoskeletal: Symmetrical with no gross deformities. Skin: Warm and dry. No rash or lesions on visible extremities. Multiple tattoos.  Extremities: No edema. Neurological: Alert oriented x 4, no focal deficits.  Psychological:  Alert and cooperative. Normal mood and affect.  ASSESSMENT AND PLAN:  43. 29 year old male with left sided ulcerative colitis diagnosed in 2011 by a flexible sigmoidoscopy treated with 6MP 11m QD from 2011 to 11/2018. Recent perianal abscess s/p  I & D and completed a course of Doxycycline.  -Colonoscopy benefits and risks discussed including risk with sedation, risk of bleeding, perforation and infection  -CBC, CMP and CRP -Further follow up to be determined after colonoscopy completed  -Avoid NSAIDs discussed    CC:  [facility]

## 2019-04-22 ENCOUNTER — Encounter: Payer: Self-pay | Admitting: Internal Medicine

## 2019-04-22 ENCOUNTER — Encounter: Payer: Self-pay | Admitting: Nurse Practitioner

## 2019-04-22 ENCOUNTER — Other Ambulatory Visit (INDEPENDENT_AMBULATORY_CARE_PROVIDER_SITE_OTHER): Payer: 59

## 2019-04-22 ENCOUNTER — Ambulatory Visit: Payer: 59 | Admitting: Nurse Practitioner

## 2019-04-22 VITALS — BP 86/62 | HR 75 | Temp 98.3°F | Ht 71.0 in | Wt 158.0 lb

## 2019-04-22 DIAGNOSIS — K51919 Ulcerative colitis, unspecified with unspecified complications: Secondary | ICD-10-CM

## 2019-04-22 DIAGNOSIS — K610 Anal abscess: Secondary | ICD-10-CM

## 2019-04-22 LAB — COMPREHENSIVE METABOLIC PANEL
ALT: 16 U/L (ref 0–53)
AST: 15 U/L (ref 0–37)
Albumin: 4.3 g/dL (ref 3.5–5.2)
Alkaline Phosphatase: 65 U/L (ref 39–117)
BUN: 11 mg/dL (ref 6–23)
CO2: 30 mEq/L (ref 19–32)
Calcium: 9.1 mg/dL (ref 8.4–10.5)
Chloride: 104 mEq/L (ref 96–112)
Creatinine, Ser: 0.92 mg/dL (ref 0.40–1.50)
GFR: 96.85 mL/min (ref >60.00)
Glucose, Bld: 92 mg/dL (ref 70–99)
Potassium: 4.2 mEq/L (ref 3.5–5.1)
Sodium: 139 mEq/L (ref 135–145)
Total Bilirubin: 0.9 mg/dL (ref 0.2–1.2)
Total Protein: 6.8 g/dL (ref 6.0–8.3)

## 2019-04-22 LAB — CBC WITH DIFFERENTIAL/PLATELET
Basophils Absolute: 0 10*3/uL (ref 0.0–0.1)
Basophils Relative: 0.7 % (ref 0.0–3.0)
Eosinophils Absolute: 0.1 10*3/uL (ref 0.0–0.7)
Eosinophils Relative: 2.3 % (ref 0.0–5.0)
HCT: 39.5 % (ref 39.0–52.0)
Hemoglobin: 13.7 g/dL (ref 13.0–17.0)
Lymphocytes Relative: 26.9 % (ref 12.0–46.0)
Lymphs Abs: 1.7 10*3/uL (ref 0.7–4.0)
MCHC: 34.6 g/dL (ref 30.0–36.0)
MCV: 84.2 fl (ref 78.0–100.0)
Monocytes Absolute: 0.6 10*3/uL (ref 0.1–1.0)
Monocytes Relative: 10.1 % (ref 3.0–12.0)
Neutro Abs: 3.9 10*3/uL (ref 1.4–7.7)
Neutrophils Relative %: 60 % (ref 43.0–77.0)
Platelets: 234 10*3/uL (ref 150.0–400.0)
RBC: 4.69 Mil/uL (ref 4.22–5.81)
RDW: 13.2 % (ref 11.5–15.5)
WBC: 6.5 10*3/uL (ref 4.0–10.5)

## 2019-04-22 LAB — HIGH SENSITIVITY CRP: CRP, High Sensitivity: 0.54 mg/L (ref 0.000–5.000)

## 2019-04-22 NOTE — Patient Instructions (Signed)
Your provider has requested that you go to the basement level for lab work before leaving today. Press "B" on the elevator. The lab is located at the first door on the left as you exit the elevator.  Please call back when you are ready to schedule your colonoscopy

## 2019-04-22 NOTE — Progress Notes (Signed)
Assessment and plans noted

## 2019-05-18 ENCOUNTER — Encounter (HOSPITAL_COMMUNITY): Payer: Self-pay

## 2019-05-18 ENCOUNTER — Other Ambulatory Visit: Payer: Self-pay

## 2019-05-18 ENCOUNTER — Emergency Department (HOSPITAL_COMMUNITY)
Admission: EM | Admit: 2019-05-18 | Discharge: 2019-05-18 | Disposition: A | Discharge status: Home / Self Care | Payer: 59 | Attending: Emergency Medicine | Admitting: Emergency Medicine

## 2019-05-18 DIAGNOSIS — L0291 Cutaneous abscess, unspecified: Secondary | ICD-10-CM | POA: Diagnosis present

## 2019-05-18 DIAGNOSIS — Z87891 Personal history of nicotine dependence: Secondary | ICD-10-CM | POA: Insufficient documentation

## 2019-05-18 DIAGNOSIS — L02416 Cutaneous abscess of left lower limb: Secondary | ICD-10-CM | POA: Insufficient documentation

## 2019-05-18 MED ORDER — LIDOCAINE-EPINEPHRINE (PF) 2 %-1:200000 IJ SOLN
20.0000 mL | Freq: Once | INTRAMUSCULAR | Status: DC
  Filled 2019-05-18: qty 20

## 2019-05-18 MED ORDER — DOXYCYCLINE HYCLATE 100 MG PO CAPS: 100.0000 mg | ORAL_CAPSULE | Freq: Two times a day (BID) | ORAL | 0 refills | Status: AC

## 2019-05-18 MED ORDER — IBUPROFEN 600 MG PO TABS: 600.0000 mg | ORAL_TABLET | Freq: Four times a day (QID) | ORAL | 0 refills | Status: DC | PRN

## 2019-05-18 MED ORDER — IBUPROFEN 400 MG PO TABS
600.0000 mg | ORAL_TABLET | Freq: Once | ORAL | Status: AC
  Administered 2019-05-18: 600 mg via ORAL
  Filled 2019-05-18: qty 2

## 2019-05-18 MED ORDER — ONDANSETRON 4 MG PO TBDP
4.0000 mg | ORAL_TABLET | Freq: Once | ORAL | Status: AC
  Administered 2019-05-18: 4 mg via ORAL
  Filled 2019-05-18: qty 1

## 2019-05-18 NOTE — ED Provider Notes (Signed)
[facility] EMERGENCY DEPARTMENT Provider Note   CSN: [id number] Arrival date & time: 05/18/19  6712     History Chief Complaint  Patient presents with  . Abscess    Benjamin Sheppard is a 29 y.o. male.  HPI   Pt is a 29 y/o male with a h/o ulcerative colitis who presents to the ED today for eval of left hip skin infection that started a few days ago. States that initially the area was small but the redness and swelling has increased in size. There has been some drainage from the wound. Denies associated fever.   Past Medical History:  Diagnosis Date  . Chronic diarrhea 07/11/2010  . Kidney stone   . Rectal bleeding 07/11/2010  . Substance abuse ([place])   . UC (ulcerative colitis) ([place]) 07/11/2010  . Ulcerative proctitis [facility])     Patient Active Problem List   Diagnosis Date Noted  . Substance induced mood disorder ([place]) 10/08/2014  . Severe recurrent major depression without psychotic features ([place]) 10/08/2014  . Opioid type dependence, continuous ([place]) 10/08/2014  . UC (ulcerative colitis) ([place]) 06/03/2012    Past Surgical History:  Procedure Laterality Date  . kidney stone removal    . SIGMOIDOSCOPY  07/2009  . WISDOM TOOTH EXTRACTION  2019       Family History  Problem Relation Age of Onset  . Healthy Mother   . Heart attack Father   . Pancreatitis Sister   . Pancreatic cancer Paternal Grandfather   . Heart disease Paternal Grandfather     Social History   Tobacco Use  . Smoking status: Former Smoker    Packs/day: 0.50    Types: Cigarettes  . Smokeless tobacco: Never Used  . Tobacco comment: quit december 2020  Substance Use Topics  . Alcohol use: Yes    Alcohol/week: 0.0 standard drinks    Comment: rarely  . Drug use: Not Currently    Types: Oxycodone, Marijuana, Benzodiazepines    Home Medications Prior to Admission medications   Medication Sig Start Date End Date Taking? Authorizing Provider  doxycycline (VIBRAMYCIN) 100 MG capsule Take 1  capsule (100 mg total) by mouth 2 (two) times daily for 7 days. 05/18/19 05/25/19  Chi Garlow S, PA-C  ibuprofen (ADVIL) 600 MG tablet Take 1 tablet (600 mg total) by mouth every 6 (six) hours as needed. 05/18/19   Ikesha Siller S, PA-C    Allergies    Patient has no known allergies.  Review of Systems   Review of Systems  Constitutional: Negative for fever.  Musculoskeletal:       Abscess  Skin: Positive for color change and wound.    Physical Exam Updated Vital Signs BP (!) 128/94 (BP Location: Left Arm)   Pulse 75   Temp 98.1 F (36.7 C) (Oral)   Resp 18   Ht 5' 11"  (1.803 m)   Wt 72.6 kg   SpO2 97%   BMI 22.32 kg/m   Physical Exam Constitutional:      General: He is not in acute distress.    Appearance: He is well-developed.  Eyes:     Conjunctiva/sclera: Conjunctivae normal.  Cardiovascular:     Rate and Rhythm: Normal rate.  Pulmonary:     Effort: Pulmonary effort is normal.  Musculoskeletal:     Comments: 3x3cm area of induration to the left hip area that is TTP. There is a small central opening noted. There is surrounding erythema that encompasses about 5x5 cm. The hip joint  is nontender and pt has normal ROM to the left hip.   Skin:    General: Skin is warm and dry.  Neurological:     Mental Status: He is alert and oriented to person, place, and time.     ED Results / Procedures / Treatments   Labs (all labs ordered are listed, but only abnormal results are displayed) Labs Reviewed - No data to display  EKG None  Radiology No results found.  Procedures .Marland KitchenIncision and Drainage  Date/Time: 05/18/2019 8:56 AM Performed by: Rodney Booze, PA-C Authorized by: Rodney Booze, PA-C   Consent:    Consent obtained:  Verbal   Consent given by:  Patient   Risks discussed:  Bleeding, incomplete drainage and pain   Alternatives discussed:  No treatment Universal protocol:    Procedure explained and questions answered to patient or proxy's  satisfaction: yes     Relevant documents present and verified: yes     Test results available and properly labeled: yes     Imaging studies available: yes     Required blood products, implants, devices, and special equipment available: yes     Site/side marked: yes     Immediately prior to procedure a time out was called: yes     Patient identity confirmed:  Verbally with patient Location:    Type:  Abscess   Size:  3x3cm   Location: left upper leg. Pre-procedure details:    Skin preparation:  Antiseptic wash Anesthesia (see MAR for exact dosages):    Anesthesia method:  Local infiltration   Local anesthetic:  Lidocaine 2% WITH epi Procedure type:    Complexity:  Simple Procedure details:    Incision types:  Single straight   Incision depth:  Subcutaneous   Scalpel blade:  11   Drainage:  Purulent   Drainage amount:  Moderate   Packing materials:  1/4 in gauze Post-procedure details:    Patient tolerance of procedure:  Tolerated well, no immediate complications   (including critical care time)  Medications Ordered in ED Medications  lidocaine-EPINEPHrine (XYLOCAINE W/EPI) 2 %-1:200000 (PF) injection 20 mL (has no administration in time range)  ibuprofen (ADVIL) tablet 600 mg (600 mg Oral Given 05/18/19 0756)  ondansetron (ZOFRAN-ODT) disintegrating tablet 4 mg (4 mg Oral Given 05/18/19 0757)    ED Course  I have reviewed the triage vital signs and the nursing notes.  Pertinent labs & imaging results that were available during my care of the patient were reviewed by me and considered in my medical decision making (see chart for details).    MDM Rules/Calculators/A&P                      Patient with skin abscess amenable to incision and drainage.  Packing was placed and pt advised to return in 2 days for packing removal and wound recheck. rx for abx given due to surrounding cellulitis. ncouraged home warm soaks and flushing. Pt voices understanding of the plan and reasons  to return. All questions answered, pt stable for discharge.   Final Clinical Impression(s) / ED Diagnoses Final diagnoses:  Abscess    Rx / DC Orders ED Discharge Orders         Ordered    doxycycline (VIBRAMYCIN) 100 MG capsule  2 times daily     05/18/19 0854    ibuprofen (ADVIL) 600 MG tablet  Every 6 hours PRN     05/18/19 0854  [place] [place] 05/18/19 4715    Milton Ferguson, MD 05/19/19 [phone]

## 2019-05-18 NOTE — ED Triage Notes (Signed)
Pt reports abscess to left hip since Friday.

## 2019-05-18 NOTE — Discharge Instructions (Signed)
You were given a prescription for antibiotics. Please take the antibiotic prescription fully.   Return for removal of packing  and wound recheck in 48 hours.   Please follow up with your primary care provider within 5-7 days for re-evaluation of your symptoms.   Please return to the emergency room immediately if you experience any new or worsening symptoms or any symptoms that indicate worsening infection such as fevers, increased redness/swelling/pain, warmth, or drainage from the affected area.

## 2019-05-21 ENCOUNTER — Ambulatory Visit (AMBULATORY_SURGERY_CENTER): Payer: Self-pay

## 2019-05-21 VITALS — Temp 97.7°F | Ht 71.0 in | Wt 160.6 lb

## 2019-05-21 DIAGNOSIS — Z1211 Encounter for screening for malignant neoplasm of colon: Secondary | ICD-10-CM

## 2019-05-21 DIAGNOSIS — K610 Anal abscess: Secondary | ICD-10-CM

## 2019-05-21 DIAGNOSIS — Z01818 Encounter for other preprocedural examination: Secondary | ICD-10-CM

## 2019-05-21 DIAGNOSIS — K51919 Ulcerative colitis, unspecified with unspecified complications: Secondary | ICD-10-CM

## 2019-05-21 MED ORDER — NA SULFATE-K SULFATE-MG SULF 17.5-3.13-1.6 GM/177ML PO SOLN: 1.0000 | Freq: Once | ORAL | 0 refills | Status: AC

## 2019-05-21 NOTE — Progress Notes (Signed)
No allergies to soy or egg Pt is not on blood thinners or diet pills Denies issues with sedation/intubation Denies atrial flutter/fib Denies constipation   Emmi instructions given to pt  Pt is aware of Covid safety and care partner requirements.

## 2019-06-03 ENCOUNTER — Encounter: Payer: Self-pay | Admitting: Internal Medicine

## 2019-06-03 ENCOUNTER — Ambulatory Visit (INDEPENDENT_AMBULATORY_CARE_PROVIDER_SITE_OTHER): Payer: 59

## 2019-06-03 ENCOUNTER — Other Ambulatory Visit: Payer: Self-pay | Admitting: Internal Medicine

## 2019-06-03 DIAGNOSIS — Z1159 Encounter for screening for other viral diseases: Secondary | ICD-10-CM

## 2019-06-04 LAB — SARS CORONAVIRUS 2 (TAT 6-24 HRS): SARS Coronavirus 2: NEGATIVE

## 2019-06-06 ENCOUNTER — Other Ambulatory Visit: Payer: Self-pay

## 2019-06-06 ENCOUNTER — Encounter: Payer: Self-pay | Admitting: Internal Medicine

## 2019-06-06 ENCOUNTER — Ambulatory Visit (AMBULATORY_SURGERY_CENTER): Payer: 59 | Admitting: Internal Medicine

## 2019-06-06 VITALS — BP 114/77 | HR 68 | Temp 97.7°F | Resp 12 | Ht 71.0 in | Wt 160.0 lb

## 2019-06-06 DIAGNOSIS — K51919 Ulcerative colitis, unspecified with unspecified complications: Secondary | ICD-10-CM

## 2019-06-06 DIAGNOSIS — K610 Anal abscess: Secondary | ICD-10-CM

## 2019-06-06 DIAGNOSIS — K5120 Ulcerative (chronic) proctitis without complications: Secondary | ICD-10-CM

## 2019-06-06 MED ORDER — SODIUM CHLORIDE 0.9 % IV SOLN
500.0000 mL | INTRAVENOUS | Status: DC
Start: 2019-06-06 — End: 2019-06-06

## 2019-06-06 NOTE — Progress Notes (Signed)
A/ox3, pleased with MAC, report to RN 

## 2019-06-06 NOTE — Patient Instructions (Signed)
YOU HAD AN ENDOSCOPIC PROCEDURE TODAY AT THE [facility]:   Refer to the procedure report that was given to you for any specific questions about what was found during the examination.  If the procedure report does not answer your questions, please call your gastroenterologist to clarify.  If you requested that your care partner not be given the details of your procedure findings, then the procedure report has been included in a sealed envelope for you to review at your convenience later.  YOU SHOULD EXPECT: Some feelings of bloating in the abdomen. Passage of more gas than usual.  Walking can help get rid of the air that was put into your GI tract during the procedure and reduce the bloating. If you had a lower endoscopy (such as a colonoscopy or flexible sigmoidoscopy) you may notice spotting of blood in your stool or on the toilet paper. If you underwent a bowel prep for your procedure, you may not have a normal bowel movement for a few days.  Please Note:  You might notice some irritation and congestion in your nose or some drainage.  This is from the oxygen used during your procedure.  There is no need for concern and it should clear up in a day or so.  SYMPTOMS TO REPORT IMMEDIATELY:   Following lower endoscopy (colonoscopy or flexible sigmoidoscopy):  Excessive amounts of blood in the stool  Significant tenderness or worsening of abdominal pains  Swelling of the abdomen that is new, acute  Fever of 100F or higher  For urgent or emergent issues, a gastroenterologist can be reached at any hour by calling [phone]. Do not use MyChart messaging for urgent concerns.    DIET:  We do recommend a small meal at first, but then you may proceed to your regular diet.  Drink plenty of fluids but you should avoid alcoholic beverages for 24 hours.  ACTIVITY:  You should plan to take it easy for the rest of today and you should NOT DRIVE or use heavy machinery until tomorrow (because  of the sedation medicines used during the test).    FOLLOW UP: Our staff will call the number listed on your records 48-72 hours following your procedure to check on you and address any questions or concerns that you may have regarding the information given to you following your procedure. If we do not reach you, we will leave a message.  We will attempt to reach you two times.  During this call, we will ask if you have developed any symptoms of COVID 19. If you develop any symptoms (ie: fever, flu-like symptoms, shortness of breath, cough etc.) before then, please call [phone].  If you test positive for Covid 19 in the 2 weeks post procedure, please call and report this information to us.    If any biopsies were taken you will be contacted by phone or by letter within the next 1-3 weeks.  Please call us at [phone] if you have not heard about the biopsies in 3 weeks.    SIGNATURES/CONFIDENTIALITY: You and/or your care partner have signed paperwork which will be entered into your electronic medical record.  These signatures attest to the fact that that the information above on your After Visit Summary has been reviewed and is understood.  Full responsibility of the confidentiality of this discharge information lies with you and/or your care-partner. 

## 2019-06-10 ENCOUNTER — Telehealth: Payer: Self-pay | Admitting: *Deleted

## 2019-06-10 ENCOUNTER — Telehealth: Payer: Self-pay

## 2019-06-10 NOTE — Telephone Encounter (Signed)
NO ANSWER, MESSAGE LEFT FOR PATIENT.

## 2019-06-10 NOTE — Telephone Encounter (Signed)
  Follow up Call-  Call back number 06/06/2019  Post procedure Call Back phone  # [phone]  Permission to leave phone message Yes  Some recent data might be hidden     Patient questions:  Do you have a fever, pain , or abdominal swelling? No. Pain Score  0 *  Have you tolerated food without any problems? Yes.    Have you been able to return to your normal activities? Yes.    Do you have any questions about your discharge instructions: Diet   No. Medications  No. Follow up visit  No.  Do you have questions or concerns about your Care? No.  Actions: * If pain score is 4 or above: No action needed, pain <4.  1. Have you developed a fever since your procedure? no  2.   Have you had an respiratory symptoms (SOB or cough) since your procedure? no  3.   Have you tested positive for COVID 19 since your procedure no  4.   Have you had any family members/close contacts diagnosed with the COVID 19 since your procedure?  no   If yes to any of these questions please route to Joylene John, RN and Erenest Rasher, RN

## 2019-06-12 ENCOUNTER — Telehealth: Payer: Self-pay | Admitting: Internal Medicine

## 2019-06-12 NOTE — Telephone Encounter (Signed)
Spoke with pt and he is aware of Dr. Loletha Carrow' recommendations.

## 2019-06-12 NOTE — Telephone Encounter (Signed)
Pt called stating that he felt a lump in rectal area when he had a bm yesterday. He believes that is either a cyst or hemorrhoid. Pt reports that it is very uncomfortable and would like some advise. Pls call him.

## 2019-06-12 NOTE — Telephone Encounter (Signed)
No active colitis or ileitis on colonoscopy 6 days ago, so not clear that this is an IBD-related perianal abscess.  He had a gluteal abscess requiring incision and drainage in ED on 03/31/19.  Could also be a swollen external hemorrhoid.  Neither are things we could treat here in the office. Recommend preparation H cream and stiz bath for 24 hours.  If not improved and/or if develops drainage and signs of infection, proceed to ED.

## 2019-06-12 NOTE — Telephone Encounter (Signed)
Benjamin Sheppard pt with hx of UC. Has had several perirectal abscesses and required I & D in March and April. Pt had colon 06/06/19. States 2 days ago he noticed an area about the size of the end of his little finger by his rectum. States it is quite painful to touch and to sit on. He has not seen any blood. Pt thinks it could be a hemorrhoid. He states he cannot come in for a visit until next week or later due to not being able to miss any more work. Pt was calling to see if there was something either OTC or script for him that could be sent in . As DOD please advise.

## 2019-07-23 ENCOUNTER — Ambulatory Visit (INDEPENDENT_AMBULATORY_CARE_PROVIDER_SITE_OTHER): Payer: 59 | Admitting: Gastroenterology

## 2020-01-04 IMAGING — CT CT ABDOMEN AND PELVIS WITH CONTRAST
2 of 4 series · 15 of 46 positions shown, 17 images · IV contrast (omnipaque)
Comparison: Report from 11/18/2010

CLINICAL DATA: Rectal bleeding for 1 month, worsening of last 14
days. History of Crohn's disease.

EXAM:
CT ABDOMEN AND PELVIS WITH CONTRAST
TECHNIQUE: Multidetector CT imaging of the abdomen and pelvis was performed
using the standard protocol following bolus administration of
intravenous contrast.
CONTRAST:  100mL OMNIPAQUE IOHEXOL 300 MG/ML  SOLN

[Series 2: axial st · axial · 0.75mm/px · z∈[+746,+1171]mm · 12 of 96 slices shown, 14 images]
[im 6/96  soft-tissue]
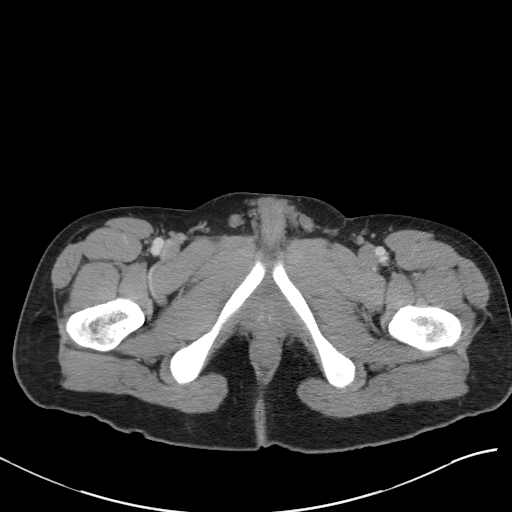
[im 6/96  bone]
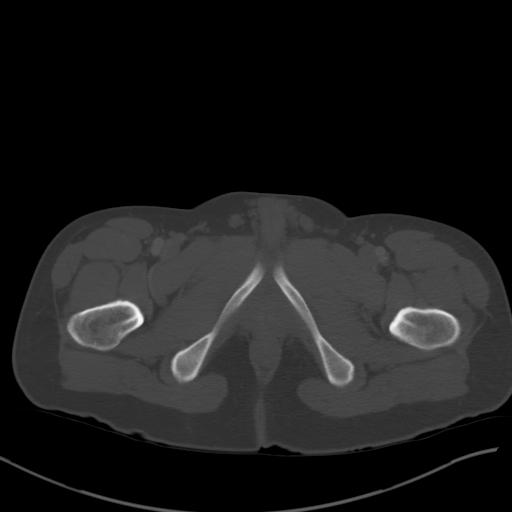
[im 16/96  soft-tissue]
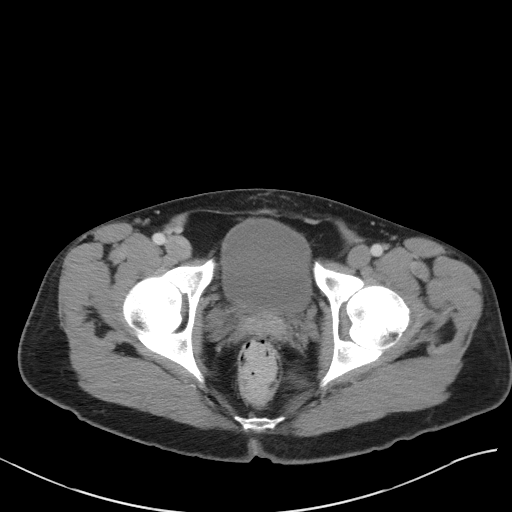
[im 21/96  soft-tissue]
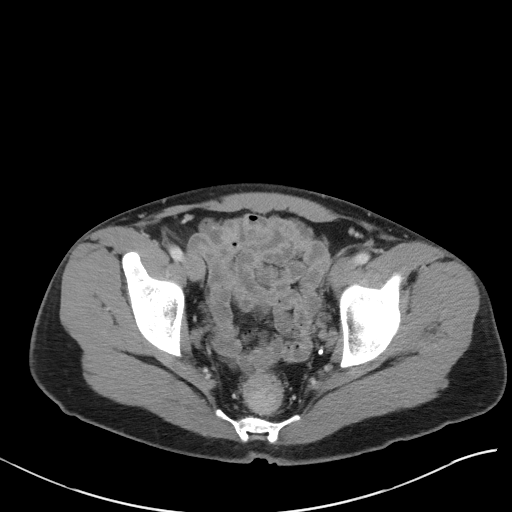
[im 31/96  soft-tissue]
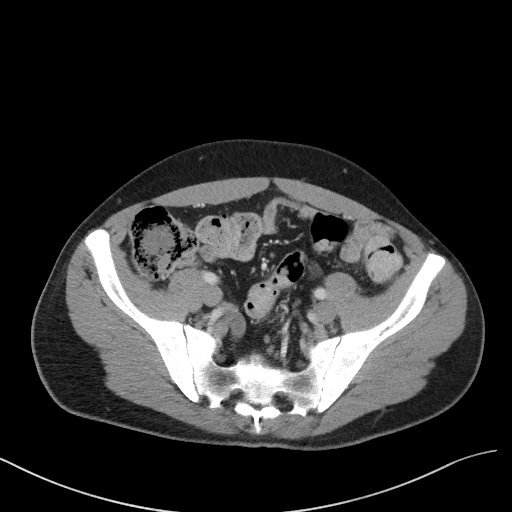
[im 36/96  soft-tissue]
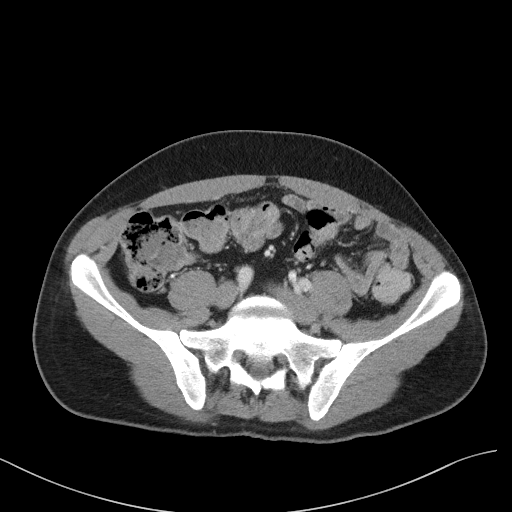
[im 46/96  soft-tissue]
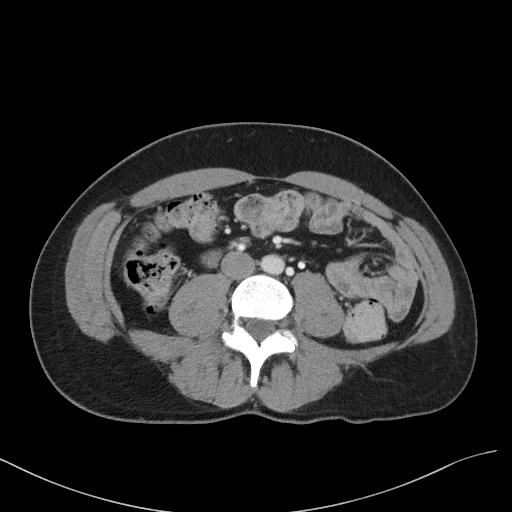
[im 51/96  soft-tissue]
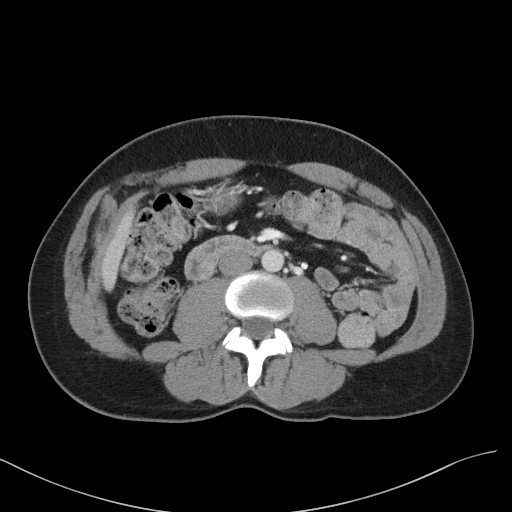
[im 61/96  soft-tissue]
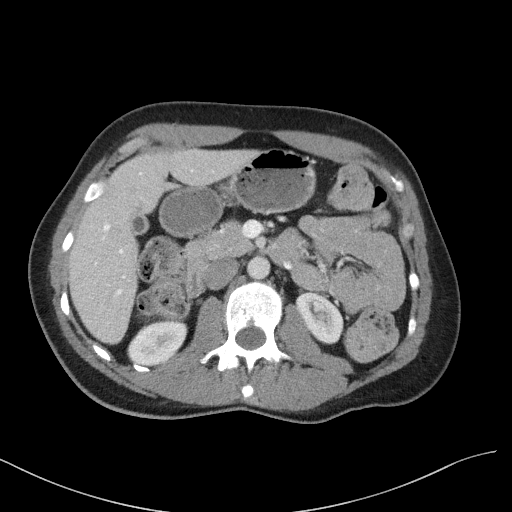
[im 66/96  soft-tissue]
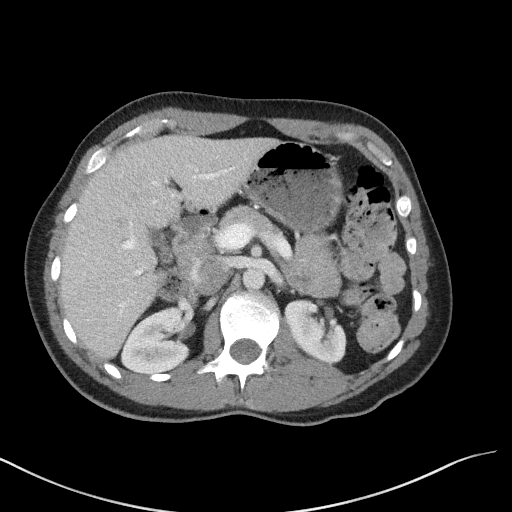
[im 66/96  bone]
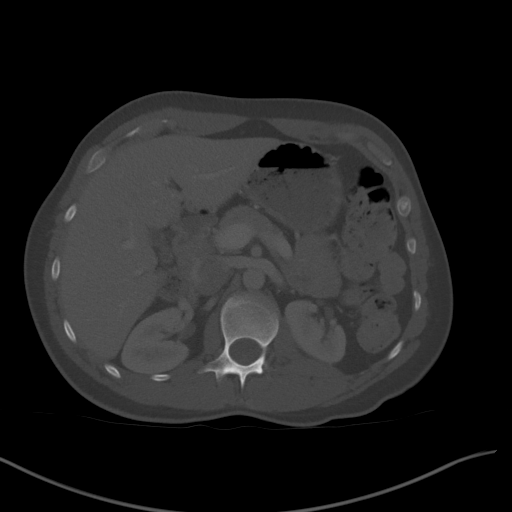
[im 76/96  soft-tissue]
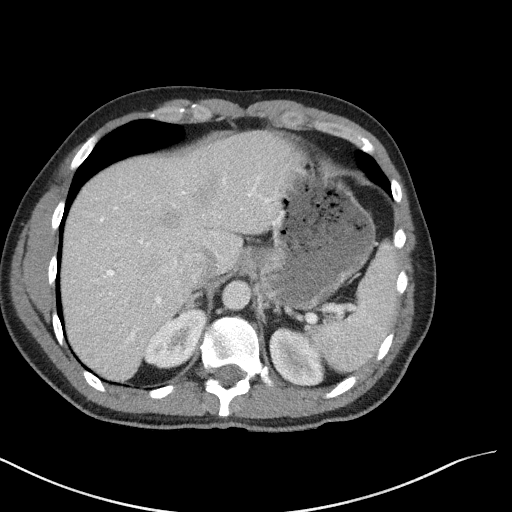
[im 81/96  soft-tissue]
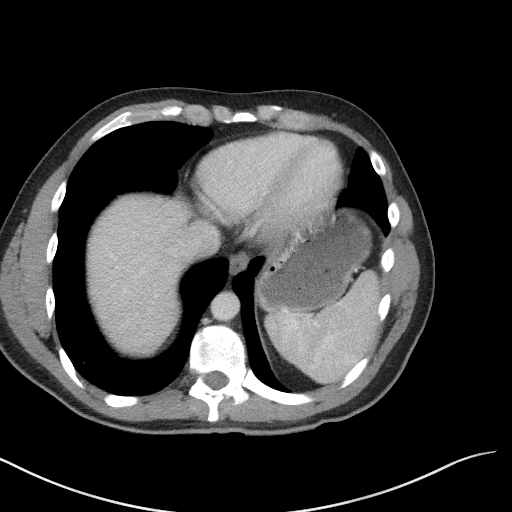
[im 91/96  soft-tissue]
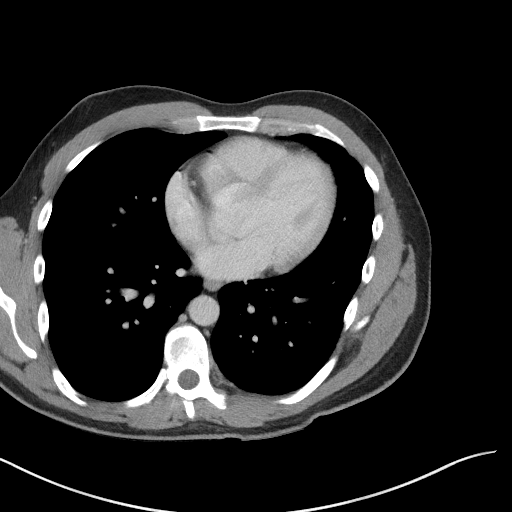

[Series 5: coronal st · coronal · 0.67mm/px · 3 of 83 slices shown]
[im 28/83  soft-tissue]
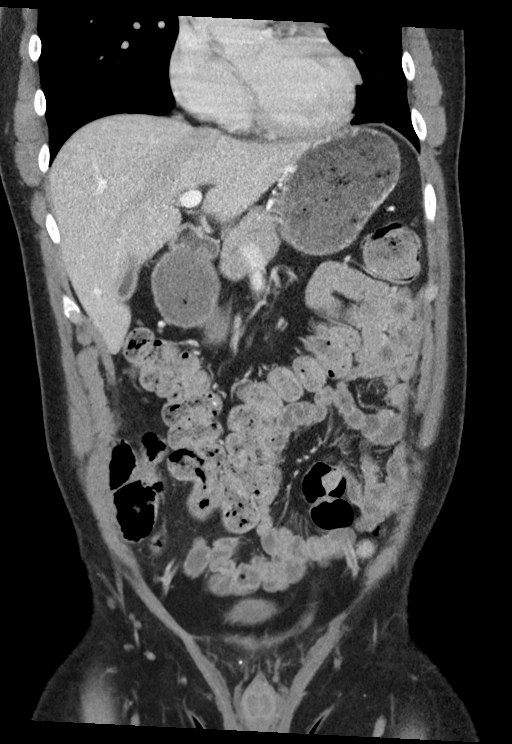
[im 37/83  soft-tissue]
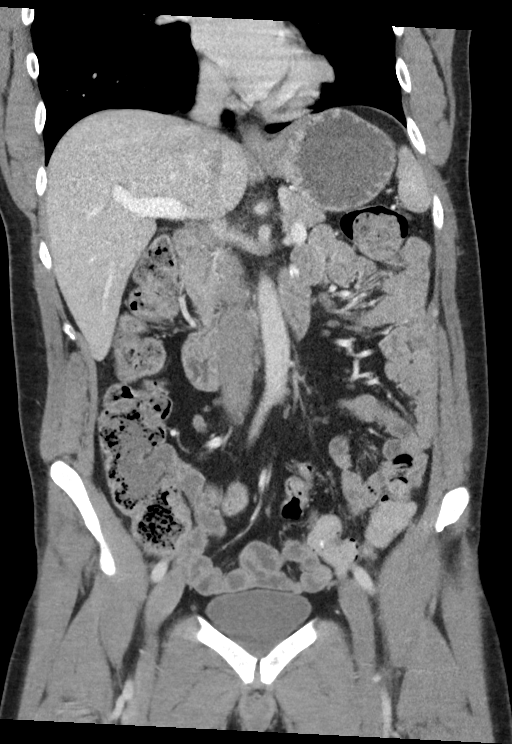
[im 46/83  soft-tissue]
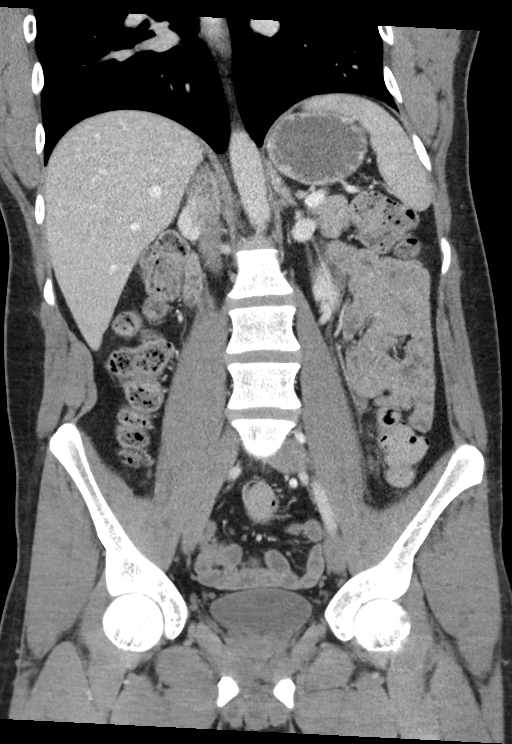

[15 of 46 positions shown; findings below may reference images not displayed]

FINDINGS: Lower chest: Subsegmental atelectasis in the posterior basal segment
left lower lobe.

Hepatobiliary: Contracted gallbladder, otherwise unremarkable.

Pancreas: Unremarkable

Spleen: Unremarkable

Adrenals/Urinary Tract: Adrenal glands normal. Non rotated left
kidney. 3 mm hypodense lesion in the left kidney lower pole on image
54/5 is technically too small to characterize but statistically
likely to be benign.

Stomach/Bowel: Prominent stool throughout the colon favors
constipation. Mildly accentuated perirectal vascularity/stranding
raise the possibility of low-grade inflammation. Small perirectal
lymph nodes up to 0.6 cm in short axis. Normal appendix. No obvious
inflammatory findings in the terminal ileum or cecum. No dilated
bowel.

Vascular/Lymphatic: Unremarkable

Reproductive: Unremarkable

Other: No supplemental non-categorized findings.

Musculoskeletal: Levoconvex thoracolumbar scoliosis although this
has not been present in the past and accordingly is probably
positional. SI joints appear unremarkable.
IMPRESSION: 1. The accentuated perirectal vascularity with small lymph nodes in
the vicinity raising the possibility of low-grade rectal
inflammation. The terminal ileum appears unremarkable.
2.  Prominent stool throughout the colon favors constipation.
3. Incidental non rotated left kidney.

## 2021-12-29 ENCOUNTER — Other Ambulatory Visit: Payer: Self-pay

## 2021-12-29 ENCOUNTER — Emergency Department (HOSPITAL_COMMUNITY): Payer: 59

## 2021-12-29 ENCOUNTER — Emergency Department (HOSPITAL_COMMUNITY)
Admission: EM | Admit: 2021-12-29 | Discharge: 2021-12-29 | Disposition: A | Discharge status: Home / Self Care | Payer: 59 | Attending: Emergency Medicine | Admitting: Emergency Medicine

## 2021-12-29 DIAGNOSIS — R1030 Lower abdominal pain, unspecified: Secondary | ICD-10-CM | POA: Diagnosis not present

## 2021-12-29 DIAGNOSIS — Z87891 Personal history of nicotine dependence: Secondary | ICD-10-CM | POA: Insufficient documentation

## 2021-12-29 DIAGNOSIS — R197 Diarrhea, unspecified: Secondary | ICD-10-CM | POA: Diagnosis not present

## 2021-12-29 DIAGNOSIS — R531 Weakness: Secondary | ICD-10-CM | POA: Insufficient documentation

## 2021-12-29 DIAGNOSIS — K625 Hemorrhage of anus and rectum: Secondary | ICD-10-CM

## 2021-12-29 DIAGNOSIS — K51919 Ulcerative colitis, unspecified with unspecified complications: Secondary | ICD-10-CM

## 2021-12-29 LAB — URINALYSIS, ROUTINE W REFLEX MICROSCOPIC
Bilirubin Urine: NEGATIVE
Glucose, UA: NEGATIVE mg/dL
Hgb urine dipstick: NEGATIVE
Ketones, ur: NEGATIVE mg/dL
Leukocytes,Ua: NEGATIVE
Nitrite: NEGATIVE
Protein, ur: NEGATIVE mg/dL
Specific Gravity, Urine: 1.016 (ref 1.005–1.030)
pH: 5 (ref 5.0–8.0)

## 2021-12-29 LAB — CBC WITH DIFFERENTIAL/PLATELET
Abs Immature Granulocytes: 0.05 10*3/uL (ref 0.00–0.07)
Basophils Absolute: 0.1 10*3/uL (ref 0.0–0.1)
Basophils Relative: 2 %
Eosinophils Absolute: 0.3 10*3/uL (ref 0.0–0.5)
Eosinophils Relative: 5 %
HCT: 42.9 % (ref 39.0–52.0)
Hemoglobin: 15.2 g/dL (ref 13.0–17.0)
Immature Granulocytes: 1 %
Lymphocytes Relative: 32 %
Lymphs Abs: 2.1 10*3/uL (ref 0.7–4.0)
MCH: 30.8 pg (ref 26.0–34.0)
MCHC: 35.4 g/dL (ref 30.0–36.0)
MCV: 87 fL (ref 80.0–100.0)
Monocytes Absolute: 0.7 10*3/uL (ref 0.1–1.0)
Monocytes Relative: 10 %
Neutro Abs: 3.4 10*3/uL (ref 1.7–7.7)
Neutrophils Relative %: 50 %
Platelets: 242 10*3/uL (ref 150–400)
RBC: 4.93 MIL/uL (ref 4.22–5.81)
RDW: 13.1 % (ref 11.5–15.5)
WBC: 6.6 10*3/uL (ref 4.0–10.5)
nRBC: 0 % (ref 0.0–0.2)

## 2021-12-29 LAB — LIPASE, BLOOD: Lipase: 62 U/L — ABNORMAL HIGH (ref 11–51)

## 2021-12-29 LAB — COMPREHENSIVE METABOLIC PANEL
ALT: 17 U/L (ref 0–44)
AST: 27 U/L (ref 15–41)
Albumin: 4.2 g/dL (ref 3.5–5.0)
Alkaline Phosphatase: 63 U/L (ref 38–126)
Anion gap: 9 (ref 5–15)
BUN: 13 mg/dL (ref 6–20)
CO2: 23 mmol/L (ref 22–32)
Calcium: 9.5 mg/dL (ref 8.9–10.3)
Chloride: 108 mmol/L (ref 98–111)
Creatinine, Ser: 1.09 mg/dL (ref 0.61–1.24)
GFR, Estimated: >60 mL/min (ref >60)
Glucose, Bld: 100 mg/dL — ABNORMAL HIGH (ref 70–99)
Potassium: 4.4 mmol/L (ref 3.5–5.1)
Sodium: 140 mmol/L (ref 135–145)
Total Bilirubin: 0.6 mg/dL (ref 0.3–1.2)
Total Protein: 7.1 g/dL (ref 6.5–8.1)

## 2021-12-29 MED ORDER — IOHEXOL 350 MG/ML SOLN
75.0000 mL | Freq: Once | INTRAVENOUS | Status: AC | PRN
  Administered 2021-12-29: 75 mL via INTRAVENOUS

## 2021-12-29 NOTE — ED Notes (Signed)
Pt to CT

## 2021-12-29 NOTE — ED Triage Notes (Signed)
Patient reports bloody stools with low abdominal cramping for 2 weeks , history of ulcerative colitis and hemorrhoids.

## 2021-12-29 NOTE — Discharge Instructions (Signed)
We evaluated you in the emergency department for your rectal bleeding and diarrhea.  Your laboratory testing and your CT scan were reassuring.  Your hemoglobin (red blood cell count) was normal.  Your symptoms may still be due to ulcerative colitis, please follow-up closely with your gastroenterologist.  We discussed the results with your [facility] gastroenterology team.  They will try to call you in the next few days to help schedule an outpatient stool study.  Please try to stop drinking alcohol as this will only worsen your symptoms.  Please return to the emergency department if you develop fevers, severe pain, worsening bleeding, lightheadedness or dizziness, fainting, or any other concerning symptoms.

## 2021-12-29 NOTE — ED Provider Notes (Signed)
[facility] EMERGENCY DEPARTMENT Provider Note  CSN: [id number] Arrival date & time: 12/29/21 7591  Chief Complaint(s) Hematochezia  HPI Benjamin Sheppard is a 31 y.o. male with history of ulcerative colitis presenting to the emergency department with diarrhea.  He reports diarrhea for around 3 days, has also had some bloody stools for around 2 weeks.  He also has some lower abdominal pain.  He is not currently taking any medicines for his ulcerative colitis, was previously on treatment.  No nausea, vomiting.  No fevers or chills.  Reports mild generalized weakness.  Denies black stools.  No recent antibiotic use or travel.   Past Medical History Past Medical History:  Diagnosis Date   Anxiety    Chronic diarrhea 07/11/2010   Heart murmur    malformed tricuspid-not acute per pt.   Kidney stone    Perianal abscess    Rectal bleeding 07/11/2010   Substance abuse ([place])    UC (ulcerative colitis) ([place]) 07/11/2010   Ulcerative proctitis [facility])    Patient Active Problem List   Diagnosis Date Noted   Substance induced mood disorder ([place]) 10/08/2014   Severe recurrent major depression without psychotic features ([place]) 10/08/2014   Opioid type dependence, continuous ([place]) 10/08/2014   UC (ulcerative colitis) ([place]) 06/03/2012   Home Medication(s) Prior to Admission medications   Medication Sig Start Date End Date Taking? Authorizing Provider  ibuprofen (ADVIL) 200 MG tablet Take 400 mg by mouth daily as needed for mild pain or moderate pain.   Yes [provider]  Multiple Vitamin (MULTIVITAMIN) tablet Take 1 tablet by mouth in the morning.   Yes [provider]                                                                                                                                    Past Surgical History Past Surgical History:  Procedure Laterality Date   kidney stone removal     SIGMOIDOSCOPY  07/2009   WISDOM TOOTH EXTRACTION  2019   Family  History Family History  Problem Relation Age of Onset   Healthy Mother    Heart attack Father    Pancreatitis Sister    Pancreatic cancer Paternal Grandfather    Heart disease Paternal Grandfather    Colon cancer Neg Hx    Colon polyps Neg Hx    Esophageal cancer Neg Hx    Rectal cancer Neg Hx    Stomach cancer Neg Hx     Social History Social History   Tobacco Use   Smoking status: Former    Packs/day: 0.50    Types: Cigarettes   Smokeless tobacco: Never   Tobacco comments:    quit december 2020  Vaping Use   Vaping Use: Former  Substance Use Topics   Alcohol use: Yes    Alcohol/week: 0.0 standard drinks of alcohol    Comment: rarely   Drug use: Not  Currently    Types: Oxycodone, Marijuana, Benzodiazepines    Comment: states was 8 yrs ago   Allergies Patient has no known allergies.  Review of Systems Review of Systems  All other systems reviewed and are negative.   Physical Exam Vital Signs  I have reviewed the triage vital signs BP 113/76   Pulse 69   Temp 98.1 F (36.7 C) (Oral)   Resp 14   SpO2 100%  Physical Exam Vitals and nursing note reviewed.  Constitutional:      General: He is not in acute distress.    Appearance: Normal appearance.  HENT:     Mouth/Throat:     Mouth: Mucous membranes are moist.  Eyes:     Conjunctiva/sclera: Conjunctivae normal.  Cardiovascular:     Rate and Rhythm: Normal rate and regular rhythm.  Pulmonary:     Effort: Pulmonary effort is normal. No respiratory distress.     Breath sounds: Normal breath sounds.  Abdominal:     General: Abdomen is flat.     Palpations: Abdomen is soft.     Tenderness: There is abdominal tenderness (LLQ).  Musculoskeletal:     Right lower leg: No edema.     Left lower leg: No edema.  Skin:    General: Skin is warm and dry.     Capillary Refill: Capillary refill takes less than 2 seconds.  Neurological:     Mental Status: He is alert and oriented to person, place, and time.  Mental status is at baseline.  Psychiatric:        Mood and Affect: Mood normal.        Behavior: Behavior normal.     ED Results and Treatments Labs (all labs ordered are listed, but only abnormal results are displayed) Labs Reviewed  COMPREHENSIVE METABOLIC PANEL - Abnormal; Notable for the following components:      Result Value   Glucose, Bld 100 (*)    All other components within normal limits  LIPASE, BLOOD - Abnormal; Notable for the following components:   Lipase 62 (*)    All other components within normal limits  CBC WITH DIFFERENTIAL/PLATELET  URINALYSIS, ROUTINE W REFLEX MICROSCOPIC                                                                                                                          Radiology CT ABDOMEN PELVIS W CONTRAST  Result Date: 12/29/2021 CLINICAL DATA:  Crohn's exacerbation. EXAM: CT ABDOMEN AND PELVIS WITH CONTRAST TECHNIQUE: Multidetector CT imaging of the abdomen and pelvis was performed using the standard protocol following bolus administration of intravenous contrast. RADIATION DOSE REDUCTION: This exam was performed according to the departmental dose-optimization program which includes automated exposure control, adjustment of the mA and/or kV according to patient size and/or use of iterative reconstruction technique. CONTRAST:  48m OMNIPAQUE IOHEXOL 350 MG/ML SOLN COMPARISON:  CT examination dated July 05, 2018 FINDINGS: Lower chest: No acute abnormality. Hepatobiliary: No focal liver abnormality  is seen. No gallstones, gallbladder wall thickening, or biliary dilatation. Pancreas: Unremarkable. No pancreatic ductal dilatation or surrounding inflammatory changes. Spleen: Normal in size without focal abnormality. Adrenals/Urinary Tract: Adrenal glands are unremarkable. Non rotated left kidney. No evidence of nephrolithiasis or hydronephrosis. Bladder is unremarkable. Stomach/Bowel: Stomach is within normal limits. Terminal ileum is unremarkable.  Appendix appears normal. No evidence of bowel wall thickening, distention, or inflammatory changes. Vascular/Lymphatic: No significant vascular findings are present. No enlarged abdominal or pelvic lymph nodes. Reproductive: Prostate is unremarkable. Other: No abdominal wall hernia or abnormality. No abdominopelvic ascites. Musculoskeletal: Mild levoscoliosis at the thoracolumbar junction. No acute osseous abnormality. IMPRESSION: 1. No CT evidence of acute abdominal/pelvic process. 2. No evidence of bowel obstruction or bowel wall thickening. Normal appendix. 3. Mild levoscoliosis at the thoracolumbar junction. Electronically Signed   By: Keane Police D.O.   On: 12/29/2021 14:05    Pertinent labs & imaging results that were available during my care of the patient were reviewed by me and considered in my medical decision making (see MDM for details).  Medications Ordered in ED Medications  iohexol (OMNIPAQUE) 350 MG/ML injection 75 mL (75 mLs Intravenous Contrast Given 12/29/21 1347)                                                                                                                                     Procedures Procedures  (including critical care time)  Medical Decision Making / ED Course   MDM:  31 year old male presenting to the emergency department with diarrhea and rectal bleeding.  Differential includes UC flare, diverticulitis, infectious diarrhea, colitis.  Will obtain CT abdomen to further evaluate.  Laboratory testing all overall reassuring.  Lipase mildly elevated but no epigastric tenderness symptoms not typical for pancreatitis.  Will reassess after CT scan.  Does have a GI appointment but not for 2 weeks.  Clinical Course as of 12/29/21 1603  Thu Dec 29, 2021  1555 CT unremarkable. Discussed with Alonza Bogus of [facility]. Agrees with outpatient follow up. She will have her office call to ask patient to provide a stool sample for Cdiff and calprotectin in the  interim until his appointment. Discussed with patient including reasons to return. Will discharge patient to home. All questions answered. Patient comfortable with plan of discharge. Return precautions discussed with patient and specified on the after visit summary.  [WS]    Clinical Course User Index [WS] Cristie Hem, MD     Additional history obtained: -External records from outside source obtained and reviewed including: Chart review including previous notes, labs, imaging, consultation notes including prior GI notes   Lab Tests: -I ordered, reviewed, and interpreted labs.   The pertinent results include:   Labs Reviewed  COMPREHENSIVE METABOLIC PANEL - Abnormal; Notable for the following components:      Result Value   Glucose, Bld 100 (*)    All other components within normal  limits  LIPASE, BLOOD - Abnormal; Notable for the following components:   Lipase 62 (*)    All other components within normal limits  CBC WITH DIFFERENTIAL/PLATELET  URINALYSIS, ROUTINE W REFLEX MICROSCOPIC    Notable for minimally elevated lipase of unclear cause. No leukocytosis or electrolyte abnormalities/.   Imaging Studies ordered: I ordered imaging studies including CT A/P On my interpretation imaging demonstrates no acute process I independently visualized and interpreted imaging. I agree with the radiologist interpretation   Medicines ordered and prescription drug management: Meds ordered this encounter  Medications   iohexol (OMNIPAQUE) 350 MG/ML injection 75 mL    -I have reviewed the patients home medicines and have made adjustments as needed   Consultations Obtained: I requested consultation with the GI team,  and discussed lab and imaging findings as well as pertinent plan - they recommend: outpatient follow up  Social Determinants of Health:  Diagnosis or treatment significantly limited by social determinants of health: alcohol use   Reevaluation: After the  interventions noted above, I reevaluated the patient and found that they have improved  Co morbidities that complicate the patient evaluation  Past Medical History:  Diagnosis Date   Anxiety    Chronic diarrhea 07/11/2010   Heart murmur    malformed tricuspid-not acute per pt.   Kidney stone    Perianal abscess    Rectal bleeding 07/11/2010   Substance abuse ([place])    UC (ulcerative colitis) ([place]) 07/11/2010   Ulcerative proctitis ([place])       Dispostion: Disposition decision including need for hospitalization was considered, and patient discharged from emergency department.    Final Clinical Impression(s) / ED Diagnoses Final diagnoses:  Rectal bleeding     This chart was dictated using voice recognition software.  Despite best efforts to proofread,  errors can occur which can change the documentation meaning.    Cristie Hem, MD 12/29/21 [phone]

## 2021-12-30 ENCOUNTER — Telehealth: Payer: Self-pay

## 2021-12-30 NOTE — Telephone Encounter (Signed)
-----   Message from Timothy Lasso, RN sent at 12/29/2021  4:20 PM EST ----- Call pt tomorrow to have stool studies completed. Order has been entered

## 2021-12-30 NOTE — Telephone Encounter (Signed)
The pt has been advised to come in for stool studies.  He will pick up as he is able.

## 2022-01-18 ENCOUNTER — Ambulatory Visit: Payer: 59 | Admitting: Nurse Practitioner

## 2022-03-22 ENCOUNTER — Other Ambulatory Visit: Payer: 59

## 2022-03-27 ENCOUNTER — Other Ambulatory Visit: Payer: 59

## 2022-03-27 DIAGNOSIS — K51919 Ulcerative colitis, unspecified with unspecified complications: Secondary | ICD-10-CM

## 2022-03-29 LAB — CLOSTRIDIUM DIFFICILE BY PCR: Toxigenic C. Difficile by PCR: NEGATIVE

## 2022-03-31 LAB — CALPROTECTIN, FECAL: Calprotectin, Fecal: 400 ug/g — ABNORMAL HIGH (ref 0–120)

## 2022-04-03 ENCOUNTER — Other Ambulatory Visit: Payer: Self-pay

## 2022-04-03 DIAGNOSIS — K625 Hemorrhage of anus and rectum: Secondary | ICD-10-CM

## 2022-04-03 DIAGNOSIS — R140 Abdominal distension (gaseous): Secondary | ICD-10-CM

## 2022-04-03 DIAGNOSIS — K51919 Ulcerative colitis, unspecified with unspecified complications: Secondary | ICD-10-CM

## 2022-04-03 NOTE — Telephone Encounter (Signed)
Benjamin Sheppard, I last saw patient in office 03/2019 and he underwent a colonoscopy by Dr. Henrene Pastor 05/2019 which was normal and he has not been seen since then.   Benjamin Sheppard, I am working in the hospital today and tomorrow. Pls see if any APP has an opening this week. If not, then you can add patient to my 3:30pm on Thursday 04/06/2022.   He needs a CBC, CMP and CRP. Send him for a stool culture as well.  C. Diff was negative. I will not prescribe 6MP at this time.   Patient to go to the ED if his symptoms worsen prior to his office appt.   Dr. Henrene Pastor, let me know if you have other recommendations at this time.  THX

## 2022-04-03 NOTE — Telephone Encounter (Signed)
Benjamin Sheppard, See him in the office as you are planning. Last colonoscopy in 2021 was entirely normal. I would NOT prescribe 6MP unless there was evidence of active IBD. He will likely need to be set up for repeat colonoscopy to reassess things. Thanks, JP

## 2022-04-03 NOTE — Telephone Encounter (Addendum)
Pt made aware of Dr. Henrene Pastor and Carl Best NP recommendations.  Order for labs placed in [facility]. Pt made aware to go to the lab for the blood work  and then come to second floor to obtain Diatherix kit for stool culture. Pt scheduled to see Carl Best NP on 04/06/2022 at 3:30 PM. Pt made aware.  Pt verbalized understanding with all questions answered.

## 2022-04-04 ENCOUNTER — Other Ambulatory Visit: Payer: Self-pay

## 2022-04-04 ENCOUNTER — Other Ambulatory Visit (INDEPENDENT_AMBULATORY_CARE_PROVIDER_SITE_OTHER): Payer: 59

## 2022-04-04 DIAGNOSIS — K51919 Ulcerative colitis, unspecified with unspecified complications: Secondary | ICD-10-CM

## 2022-04-04 DIAGNOSIS — R140 Abdominal distension (gaseous): Secondary | ICD-10-CM

## 2022-04-04 DIAGNOSIS — K625 Hemorrhage of anus and rectum: Secondary | ICD-10-CM | POA: Diagnosis not present

## 2022-04-04 LAB — COMPREHENSIVE METABOLIC PANEL
ALT: 15 U/L (ref 0–53)
AST: 20 U/L (ref 0–37)
Albumin: 4.4 g/dL (ref 3.5–5.2)
Alkaline Phosphatase: 72 U/L (ref 39–117)
BUN: 12 mg/dL (ref 6–23)
CO2: 24 mEq/L (ref 19–32)
Calcium: 9.4 mg/dL (ref 8.4–10.5)
Chloride: 108 mEq/L (ref 96–112)
Creatinine, Ser: 0.99 mg/dL (ref 0.40–1.50)
GFR: 100.76 mL/min (ref >60.00)
Glucose, Bld: 81 mg/dL (ref 70–99)
Potassium: 3.9 mEq/L (ref 3.5–5.1)
Sodium: 143 mEq/L (ref 135–145)
Total Bilirubin: 0.5 mg/dL (ref 0.2–1.2)
Total Protein: 7.3 g/dL (ref 6.0–8.3)

## 2022-04-04 LAB — CBC WITH DIFFERENTIAL/PLATELET
Basophils Absolute: 0.1 10*3/uL (ref 0.0–0.1)
Basophils Relative: 1.2 % (ref 0.0–3.0)
Eosinophils Absolute: 0.3 10*3/uL (ref 0.0–0.7)
Eosinophils Relative: 4.4 % (ref 0.0–5.0)
HCT: 43.3 % (ref 39.0–52.0)
Hemoglobin: 14.8 g/dL (ref 13.0–17.0)
Lymphocytes Relative: 30.5 % (ref 12.0–46.0)
Lymphs Abs: 2.2 10*3/uL (ref 0.7–4.0)
MCHC: 34.2 g/dL (ref 30.0–36.0)
MCV: 86.2 fl (ref 78.0–100.0)
Monocytes Absolute: 0.5 10*3/uL (ref 0.1–1.0)
Monocytes Relative: 6.6 % (ref 3.0–12.0)
Neutro Abs: 4.1 10*3/uL (ref 1.4–7.7)
Neutrophils Relative %: 57.3 % (ref 43.0–77.0)
Platelets: 242 10*3/uL (ref 150.0–400.0)
RBC: 5.03 Mil/uL (ref 4.22–5.81)
RDW: 12.4 % (ref 11.5–15.5)
WBC: 7.1 10*3/uL (ref 4.0–10.5)

## 2022-04-04 LAB — C-REACTIVE PROTEIN: CRP: <1 mg/dL (ref 0.5–20.0)

## 2022-04-04 NOTE — Progress Notes (Unsigned)
04/04/2022 [place] Quinones Sheppard:[id number] 1991-01-15   Chief Complaint:  History of Present Illness: Benjamin Sheppard is a 32 year old male with a past medical history of past polysubstance abuse, kidney stones and  ulcerative colitis initially diagnosed by a flexible sigmoidoscopy done by Dr. Laural Golden in 2011 initially treated with 6MP from 2011 - Nov. 2020 and perianal abscess s/p I & D 03/2019.  Initially saw Damorion in our clinic 04/22/2019, at that time he elected to transition his GI management from Dr. Gentry Fitz office to [facility] gastroenterology.  He underwent a colonoscopy by Dr. Henrene Pastor 06/06/2019 which showed a normal colon, no evidence of UC/IBD.  At that time, he was asymptomatic and he was advised to follow-up as needed.  He contacted our office 04/02/2022 with complaints of terrible bloating and passing bloody bowel movements. He sent a MyChart message requesting 6-MP after his fecal calprotectin level came back elevated at 400.  Is advised to go to the lab for CBC, CMP and CRP and he was scheduled for an urgent office appointment.  He presented to [facility] ED 12/29/2021 with bloody diarrhea x 2 weeks.  Labs in the ED showed WBC count 6.6.  Hemoglobin 15.2.  Platelet 242.  CTAP without evidence of acute intra-abdominal/pelvic pathology, no evidence of colitis or bowel obstruction.  The ED provider contacted Alonza Bogus PA-C from our inpatient GI service who recommended outpatient follow-up, C. difficile and fecal calprotectin tests.  He was scheduled for a follow-up appointment in our office 01/18/2022, and, he canceled this appointment and did not reschedule.  He completed the C. difficile test 03/27/2022 which was negative and fecal calprotectin level was 400.     Latest Ref Rng & Units 12/29/2021    7:04 AM 04/22/2019    3:23 PM 07/05/2018    6:37 PM  CBC  WBC 4.0 - 10.5 K/uL 6.6  6.5  5.9   Hemoglobin 13.0 - 17.0 g/dL 15.2  13.7  13.4   Hematocrit 39.0 - 52.0 % 42.9  39.5  38.8    Platelets 150 - 400 K/uL 242  234.0  236        Latest Ref Rng & Units 12/29/2021    7:04 AM 04/22/2019    3:23 PM 07/05/2018    6:37 PM  CMP  Glucose 70 - 99 mg/dL 100  92  96   BUN 6 - 20 mg/dL '13  11  13   '$ Creatinine 0.61 - 1.24 mg/dL 1.09  0.92  1.13   Sodium 135 - 145 mmol/L 140  139  140   Potassium 3.5 - 5.1 mmol/L 4.4  4.2  4.0   Chloride 98 - 111 mmol/L 108  104  105   CO2 22 - 32 mmol/L '23  30  25   '$ Calcium 8.9 - 10.3 mg/dL 9.5  9.1  9.0   Total Protein 6.5 - 8.1 g/dL 7.1  6.8  6.9   Total Bilirubin 0.3 - 1.2 mg/dL 0.6  0.9  0.9   Alkaline Phos 38 - 126 U/L 63  65  67   AST 15 - 41 U/L '27  15  20   '$ ALT 0 - 44 U/L '17  16  18     '$ PAST GI PROCEDURES:  Colonoscopy by Dr. Henrene Pastor 06/06/2019: - The entire examined colon is normal on direct and retroflexion views. Normal terminal ileum.  - No evidence for active colitis.  Flexible sigmoidoscopy by Dr. Laural Golden 2011:  Distal coliltis involving the rectum and sigmoid colon with transition at 35 cm from the anal margin. Endoscopic appearance typical of UC.      Current Medications, Allergies, Past Medical History, Past Surgical History, Family History and Social History were reviewed in [employer] record.   Review of Systems:   Constitutional: Negative for fever, sweats, chills or weight loss.  Respiratory: Negative for shortness of breath.   Cardiovascular: Negative for chest pain, palpitations and leg swelling.  Gastrointestinal: See HPI.  Musculoskeletal: Negative for back pain or muscle aches.  Neurological: Negative for dizziness, headaches or paresthesias.    Physical Exam: There were no vitals taken for this visit. General: in no acute distress. Head: Normocephalic and atraumatic. Eyes: No scleral icterus. Conjunctiva pink . Ears: Normal auditory acuity. Mouth: Dentition intact. No ulcers or lesions.  Lungs: Clear throughout to auscultation. Heart: Regular rate and rhythm, no  murmur. Abdomen: Soft, nontender and nondistended. No masses or hepatomegaly. Normal bowel sounds x 4 quadrants.  Rectal: *** Musculoskeletal: Symmetrical with no gross deformities. Extremities: No edema. Neurological: Alert oriented x 4. No focal deficits.  Psychological: Alert and cooperative. Normal mood and affect  Assessment and Recommendations: ***

## 2022-04-06 ENCOUNTER — Encounter: Payer: Self-pay | Admitting: Nurse Practitioner

## 2022-04-06 ENCOUNTER — Ambulatory Visit: Payer: 59 | Admitting: Nurse Practitioner

## 2022-04-06 VITALS — BP 114/80 | HR 88 | Ht 71.0 in | Wt 172.5 lb

## 2022-04-06 DIAGNOSIS — K5190 Ulcerative colitis, unspecified, without complications: Secondary | ICD-10-CM | POA: Diagnosis not present

## 2022-04-06 MED ORDER — DICYCLOMINE HCL 10 MG PO CAPS: 10.0000 mg | ORAL_CAPSULE | Freq: Two times a day (BID) | ORAL | 1 refills | Status: DC | PRN

## 2022-04-06 NOTE — Patient Instructions (Signed)
Please call our office when you are ready to schedule a colonoscopy.  We have sent the following medications to your pharmacy for you to pick up at your convenience: Dicyclomine 10 mg  Due to recent changes in healthcare laws, you may see the results of your imaging and laboratory studies on MyChart before your provider has had a chance to review them.  We understand that in some cases there may be results that are confusing or concerning to you. Not all laboratory results come back in the same time frame and the provider may be waiting for multiple results in order to interpret others.  Please give [place] 48 hours in order for your provider to thoroughly review all the results before contacting the office for clarification of your results.   Thank you for trusting me with your gastrointestinal care!   Carl Best, CRNP

## 2022-04-06 NOTE — Progress Notes (Signed)
Noted  

## 2022-04-11 ENCOUNTER — Encounter: Payer: Self-pay | Admitting: Nurse Practitioner

## 2022-04-14 ENCOUNTER — Ambulatory Visit: Payer: 59 | Admitting: Physician Assistant

## 2022-05-18 ENCOUNTER — Encounter: Payer: Self-pay | Admitting: Internal Medicine

## 2022-05-30 ENCOUNTER — Encounter: Payer: Self-pay | Admitting: Internal Medicine

## 2022-05-30 ENCOUNTER — Ambulatory Visit (AMBULATORY_SURGERY_CENTER): Payer: 59

## 2022-05-30 VITALS — Ht 71.0 in | Wt 170.0 lb

## 2022-05-30 DIAGNOSIS — Z1211 Encounter for screening for malignant neoplasm of colon: Secondary | ICD-10-CM

## 2022-05-30 DIAGNOSIS — K610 Anal abscess: Secondary | ICD-10-CM

## 2022-05-30 DIAGNOSIS — Z01818 Encounter for other preprocedural examination: Secondary | ICD-10-CM

## 2022-05-30 DIAGNOSIS — K51919 Ulcerative colitis, unspecified with unspecified complications: Secondary | ICD-10-CM

## 2022-05-30 MED ORDER — NA SULFATE-K SULFATE-MG SULF 17.5-3.13-1.6 GM/177ML PO SOLN: 1.0000 | Freq: Once | ORAL | 0 refills | Status: AC

## 2022-05-30 NOTE — Progress Notes (Signed)

## 2022-06-22 ENCOUNTER — Ambulatory Visit (AMBULATORY_SURGERY_CENTER): Payer: 59 | Admitting: Internal Medicine

## 2022-06-22 ENCOUNTER — Encounter: Payer: Self-pay | Admitting: Internal Medicine

## 2022-06-22 VITALS — BP 98/64 | HR 51 | Temp 99.1°F | Resp 12 | Ht 71.0 in | Wt 170.0 lb

## 2022-06-22 DIAGNOSIS — D123 Benign neoplasm of transverse colon: Secondary | ICD-10-CM | POA: Diagnosis present

## 2022-06-22 DIAGNOSIS — K635 Polyp of colon: Secondary | ICD-10-CM | POA: Diagnosis not present

## 2022-06-22 DIAGNOSIS — K529 Noninfective gastroenteritis and colitis, unspecified: Secondary | ICD-10-CM | POA: Diagnosis not present

## 2022-06-22 DIAGNOSIS — K51919 Ulcerative colitis, unspecified with unspecified complications: Secondary | ICD-10-CM | POA: Diagnosis not present

## 2022-06-22 DIAGNOSIS — Z1211 Encounter for screening for malignant neoplasm of colon: Secondary | ICD-10-CM

## 2022-06-22 MED ORDER — SODIUM CHLORIDE 0.9 % IV SOLN: 500.0000 mL | Freq: Once | INTRAVENOUS | Status: DC

## 2022-06-22 MED ORDER — MESALAMINE 1.2 G PO TBEC: 1.2000 g | DELAYED_RELEASE_TABLET | Freq: Four times a day (QID) | ORAL | 11 refills | Status: DC

## 2022-06-22 MED ORDER — MESALAMINE 4 G RE ENEM: 4.0000 g | ENEMA | Freq: Every day | RECTAL | Status: AC

## 2022-06-22 NOTE — Progress Notes (Signed)
04/04/2022 Benjamin Sheppard [id number] 1990/05/12     Chief Complaint: Bloody diarrhea   History of Present Illness: Benjamin Sheppard is a 32 year old male with a past medical history of past polysubstance abuse, kidney stones and  ulcerative colitis initially diagnosed by a flexible sigmoidoscopy done by Dr. Karilyn Cota in 2011 initially treated with from 2011 - Nov. 2020 and perianal abscess s/p I & D 03/2019.  Initially saw Ayhan in our clinic 04/22/2019, at that time he elected to transition his GI management from Dr. Clovis Cao office to [facility] gastroenterology.  He underwent a colonoscopy by Dr. Marina Goodell 06/06/2019 which showed a normal colon, no evidence of UC/IBD.  At that time, he was asymptomatic and he was advised to follow-up as needed.   He presented to [facility] ED 12/29/2021 with bloody diarrhea x 2 weeks.  Labs in the ED showed WBC count 6.6.  Hemoglobin 15.2.  Platelet 242.  CTAP without evidence of acute intra-abdominal/pelvic pathology, no evidence of colitis or bowel obstruction.  The ED provider contacted Doug Sou PA-C from our inpatient GI service who recommended outpatient follow-up, C. difficile and fecal calprotectin tests.  He was scheduled for a follow-up appointment in our office 01/18/2022, however, he canceled this appointment because his symptoms abated.   He contacted our office 04/02/2022 with complaints of terrible bloating and passing multiple loose bloody bowel movements with intermittent bloody mucus.  C. difficile PCR was negative and fecal calprotectin level was markedly elevated at 400 on 03/27/2022. Diatherix stool culture 04/04/2022 was negative.  Normal CBC, CMP and CRP.   Presents today for further evaluation.  He reported passing 2-3 loose stools daily since November 2023 and developed bloody stools with abdominal bloat and cramping 3 weeks ago.  However, for the past week he has passed a brown solid stool with a scant amount of blood.  He has less  abdominal cramping.  No abdominal or anorectal pain.  He suspects his colitis symptoms flare when he is stressed.  He has chronic lower back pain for which he takes Advil 200 mg 3 tabs 3 to 4 days weekly while at work for the past 2 to 3 years.  He drinks 2-3 caffeinated sodas daily but for the past month he is drinking only 1 soda daily.  He is eating a low residue diet for the past few weeks as well.  No fevers.  No noticeable weight loss.       Latest Ref Rng & Units 04/04/2022   12:20 PM 12/29/2021    7:04 AM 04/22/2019    3:23 PM  CBC  WBC 4.0 - 10.5 K/uL 7.1  6.6  6.5   Hemoglobin 13.0 - 17.0 g/dL 40.9  81.1  91.4   Hematocrit 39.0 - 52.0 % 43.3  42.9  39.5   Platelets 150.0 - 400.0 K/uL 242.0  242  234.0         Latest Ref Rng & Units 04/04/2022   12:20 PM 12/29/2021    7:04 AM 04/22/2019    3:23 PM  CMP  Glucose 70 - 99 mg/dL 81  782  92   BUN 6 - 23 mg/dL 12  13  11    Creatinine 0.40 - 1.50 mg/dL 9.56  2.13  0.86   Sodium 135 - 145 mEq/L 143  140  139   Potassium 3.5 - 5.1 mEq/L 3.9  4.4  4.2   Chloride 96 - 112 mEq/L 108  108  104   CO2 19 - 32 mEq/L 24  23  30    Calcium 8.4 - 10.5 mg/dL 9.4  9.5  9.1   Total Protein 6.0 - 8.3 g/dL 7.3  7.1  6.8   Total Bilirubin 0.2 - 1.2 mg/dL 0.5  0.6  0.9   Alkaline Phos 39 - 117 U/L 72  63  65   AST 0 - 37 U/L 20  27  15    ALT 0 - 53 U/L 15  17  16      CRPN < 1    PAST GI PROCEDURES:   Colonoscopy by Dr. Marina Goodell 06/06/2019: - The entire examined colon is normal on direct and retroflexion views. Normal terminal ileum.  - No evidence for active colitis.   Flexible sigmoidoscopy by Dr. Karilyn Cota 2011:  Distal coliltis involving the rectum and sigmoid colon with transition at 35 cm from the anal margin. Endoscopic appearance typical of UC.          Current Outpatient Medications on File Prior to Visit  Medication Sig Dispense Refill   ibuprofen (ADVIL) 200 MG tablet Take 400 mg by mouth daily as needed for mild pain or moderate pain.         No current facility-administered medications on file prior to visit.    No Known Allergies   Current Medications, Allergies, Past Medical History, Past Surgical History, Family History and Social History were reviewed in [employer] record.   Review of Systems:   Constitutional: Negative for fever, sweats, chills or weight loss.  Respiratory: Negative for shortness of breath.   Cardiovascular: Negative for chest pain, palpitations and leg swelling.  Gastrointestinal: See HPI.  Musculoskeletal: Negative for back pain or muscle aches.  Neurological: Negative for dizziness, headaches or paresthesias.    Physical Exam: BP 114/80 (BP Location: Left Arm, Patient Position: Sitting, Cuff Size: Normal)   Pulse 88   Ht 5\' 11"  (1.803 m)   Wt 172 lb 8 oz (78.2 kg)   BMI 24.06 kg/m    General: 32 year old male in no acute distress. Head: Normocephalic and atraumatic. Eyes: No scleral icterus. Conjunctiva pink . Ears: Normal auditory acuity. Mouth: Dentition intact. No ulcers or lesions.  Lungs: Clear throughout to auscultation. Heart: Regular rate and rhythm, no murmur. Abdomen: Soft, nontender and nondistended. No masses or hepatomegaly. Normal bowel sounds x 4 quadrants.  Rectal: Deferred. Musculoskeletal: Symmetrical with no gross deformities. Extremities: No edema. Neurological: Alert oriented x 4. No focal deficits.  Psychological: Alert and cooperative. Normal mood and affect   Assessment and Recommendations:   32 year old male with a history of ulcerative colitis, with recent colitis flare consisting of bright red bloody loose stools and abdominal cramping.  Off 6-MP since 11/2018.  Normal colonoscopy 05/2019.  Negative C. difficile and Diatherix stool culture.  Elevated fecal calprotectin level.  Normal CBC, CMP and CRP.  Colitis symptoms abated 1 week ago. -Colonoscopy benefits and risks discussed including risk with sedation, risk of bleeding,  perforation and infection  -Patient instructed to avoid NSAIDs.  Follow-up with PCP regarding back pain management, avoid narcotics.  Tylenol 325 mg 2 tabs twice daily as needed. -Push fluids, continue low residue diet -Dicyclomine 10 mg 1 p.o. twice daily for abdominal cramping -Further recommendations to be determined after colonoscopy completed -Patient to contact office if bloody loose stools recur   Recent complete H&P as above.  No interval changes.  Now for colonoscopy

## 2022-06-22 NOTE — Progress Notes (Signed)
VS by CW  Pt's states no medical or surgical changes since previsit or office visit.  

## 2022-06-22 NOTE — Patient Instructions (Addendum)
Resume all of your previous medications today as ordered.  Read the handouts given to you by your recover room nurse.  Continue to pass gas.  Take your new medications as ordered.  Keep your follow-p appointment.  YOU HAD AN ENDOSCOPIC PROCEDURE TODAY AT THE [facility]:   Refer to the procedure report that was given to you for any specific questions about what was found during the examination.  If the procedure report does not answer your questions, please call your gastroenterologist to clarify.  If you requested that your care partner not be given the details of your procedure findings, then the procedure report has been included in a sealed envelope for you to review at your convenience later.  YOU SHOULD EXPECT: Some feelings of bloating in the abdomen. Passage of more gas than usual.  Walking can help get rid of the air that was put into your GI tract during the procedure and reduce the bloating. If you had a lower endoscopy (such as a colonoscopy or flexible sigmoidoscopy) you may notice spotting of blood in your stool or on the toilet paper. If you underwent a bowel prep for your procedure, you may not have a normal bowel movement for a few days.  Please Note:  You might notice some irritation and congestion in your nose or some drainage.  This is from the oxygen used during your procedure.  There is no need for concern and it should clear up in a day or so.  SYMPTOMS TO REPORT IMMEDIATELY:  Following lower endoscopy (colonoscopy or flexible sigmoidoscopy):  Excessive amounts of blood in the stool  Significant tenderness or worsening of abdominal pains  Swelling of the abdomen that is new, acute  Fever of 100F or higher   For urgent or emergent issues, a gastroenterologist can be reached at any hour by calling [phone]. Do not use MyChart messaging for urgent concerns.    DIET:  We do recommend a small meal at first, but then you may proceed to your regular diet.   Drink plenty of fluids but you should avoid alcoholic beverages for 24 hours.  ACTIVITY:  You should plan to take it easy for the rest of today and you should NOT DRIVE or use heavy machinery until tomorrow (because of the sedation medicines used during the test).    FOLLOW UP: Our staff will call the number listed on your records the next business day following your procedure.  We will call around 7:15- 8:00 am to check on you and address any questions or concerns that you may have regarding the information given to you following your procedure. If we do not reach you, we will leave a message.     If any biopsies were taken you will be contacted by phone or by letter within the next 1-3 weeks.  Please call us at [phone] if you have not heard about the biopsies in 3 weeks.    SIGNATURES/CONFIDENTIALITY: You and/or your care partner have signed paperwork which will be entered into your electronic medical record.  These signatures attest to the fact that that the information above on your After Visit Summary has been reviewed and is understood.  Full responsibility of the confidentiality of this discharge information lies with you and/or your care-partner.YOU HAD AN ENDOSCOPIC PROCEDURE TODAY AT THE  ENDOSCOPY CENTER:   Refer to the procedure report that was given to you for any specific questions about what was found during the examination.  If  the procedure report does not answer your questions, please call your gastroenterologist to clarify.  If you requested that your care partner not be given the details of your procedure findings, then the procedure report has been included in a sealed envelope for you to review at your convenience later.  YOU SHOULD EXPECT: Some feelings of bloating in the abdomen. Passage of more gas than usual.  Walking can help get rid of the air that was put into your GI tract during the procedure and reduce the bloating. If you had a lower endoscopy (such as a  colonoscopy or flexible sigmoidoscopy) you may notice spotting of blood in your stool or on the toilet paper. If you underwent a bowel prep for your procedure, you may not have a normal bowel movement for a few days.  Please Note:  You might notice some irritation and congestion in your nose or some drainage.  This is from the oxygen used during your procedure.  There is no need for concern and it should clear up in a day or so.  SYMPTOMS TO REPORT IMMEDIATELY:  Following lower endoscopy (colonoscopy or flexible sigmoidoscopy):  Excessive amounts of blood in the stool  Significant tenderness or worsening of abdominal pains  Swelling of the abdomen that is new, acute  Fever of 100F or higher   For urgent or emergent issues, a gastroenterologist can be reached at any hour by calling [phone]. Do not use MyChart messaging for urgent concerns.    DIET:  We do recommend a small meal at first, but then you may proceed to your regular diet.  Drink plenty of fluids but you should avoid alcoholic beverages for 24 hours.  ACTIVITY:  You should plan to take it easy for the rest of today and you should NOT DRIVE or use heavy machinery until tomorrow (because of the sedation medicines used during the test).    FOLLOW UP: Our staff will call the number listed on your records the next business day following your procedure.  We will call around 7:15- 8:00 am to check on you and address any questions or concerns that you may have regarding the information given to you following your procedure. If we do not reach you, we will leave a message.     If any biopsies were taken you will be contacted by phone or by letter within the next 1-3 weeks.  Please call us at [phone] if you have not heard about the biopsies in 3 weeks.    SIGNATURES/CONFIDENTIALITY: You and/or your care partner have signed paperwork which will be entered into your electronic medical record.  These signatures attest to the  fact that that the information above on your After Visit Summary has been reviewed and is understood.  Full responsibility of the confidentiality of this discharge information lies with you and/or your care-partner.

## 2022-06-22 NOTE — Progress Notes (Signed)
Called to room to assist during endoscopic procedure.  Patient ID and intended procedure confirmed with present staff. Received instructions for my participation in the procedure from the performing physician.  

## 2022-06-22 NOTE — Op Note (Signed)
[facility] Patient Name: Benjamin Sheppard Procedure Date: 06/22/2022 11:24 AM MRN: [id number] Endoscopist: Wilhemina Bonito. Marina Goodell , MD, [id number] Age: 32 Referring MD:  Date of Birth: May 20, 1990 Gender: Male Account #: [id number] Procedure:                Colonoscopy with biopsies; cold snare polypectomy x  Indications:              Rectal bleeding. Diagnosed with ulcerative colitis                            elsewhere on sigmoidoscopy 2011. Eventually                            establish with this practice. Last underwent                            colonoscopy Jun 06, 2019 no medical therapy. The                            exam was normal. He has been having intermittent                            problems with change in bowel habits and rectal                            bleeding over the past 7 to 10 months. Now for                            colonoscopy Medicines:                Monitored Anesthesia Care Procedure:                Pre-Anesthesia Assessment:                           - Prior to the procedure, a History and Physical                            was performed, and patient medications and                            allergies were reviewed. The patient's tolerance of                            previous anesthesia was also reviewed. The risks                            and benefits of the procedure and the sedation                            options and risks were discussed with the patient.                            All questions were answered, and informed consent  was obtained. Prior Anticoagulants: The patient has                            taken no anticoagulant or antiplatelet agents. ASA                            Grade Assessment: I - A normal, healthy patient.                            After reviewing the risks and benefits, the patient                            was deemed in satisfactory condition to undergo the                             procedure.                           After obtaining informed consent, the colonoscope                            was passed under direct vision. Throughout the                            procedure, the patient's blood pressure, pulse, and                            oxygen saturations were monitored continuously. The                            Olympus SN [id number] was introduced through the anus                            and advanced to the the cecum, identified by                            appendiceal orifice and ileocecal valve. The                            ileocecal valve, appendiceal orifice, and rectum                            were photographed. The quality of the bowel                            preparation was excellent. The colonoscopy was                            performed without difficulty. The patient tolerated                            the procedure well. The bowel preparation used was  SUPREP via split dose instruction. Scope In: 11:41:10 AM Scope Out: 11:57:29 AM Scope Withdrawal Time: 0 hours 11 minutes 3 seconds  Total Procedure Duration: 0 hours 16 minutes 19 seconds  Findings:                 A 3 mm polyp was found in the transverse colon. The                            polyp was sessile. The polyp was removed with a                            cold snare. Resection and retrieval were complete.                           There was active circumferential contiguous colitis                            involving the rectum and rectosigmoid region to                            about 20 to 25 cm. Multiple biopsies taken. Beyond                            this the colon was entirely normal to the cecum.                            Biopsies of the right colon and transverse colon                            were taken. The examination was otherwise normal on                            direct and retroflexion views. Complications:            No  immediate complications. Estimated blood loss:                            None. Estimated Blood Loss:     Estimated blood loss: none. Impression:               - One 3 mm polyp in the transverse colon, removed                            with a cold snare. Resected and retrieved.                           - Active proctosigmoiditis. There was active                            circumferential contiguous colitis involving the                            rectum and rectosigmoid region to about 20 to 25  cm. Multiple biopsies taken. Beyond this the colon                            was entirely normal to the cecum. Biopsies of the                            right colon and transverse colon were taken. The                            examination was otherwise normal on direct and                            retroflexion views. Recommendation:           - Repeat colonoscopy (date not yet determined) for                            surveillance.                           - Patient has a contact number available for                            emergencies. The signs and symptoms of potential                            delayed complications were discussed with the                            patient. Return to normal activities tomorrow.                            Written discharge instructions were provided to the                            patient.                           - Resume previous diet.                           - Continue present medications.                           - Await pathology results.                           -Prescribe Lialda 1.2 g; #120; 11 refills; take 4                            (4.8 g) p.o. daily                           -Prescribe Rowasa enemas; #30; 11 refills;  administer 1 per rectum at night. Try to hold for                            30 minutes or longer.                           -Office follow-up with Dr. Marina Goodell  in 6 weeks Wilhemina Bonito. Marina Goodell, MD 06/22/2022 12:12:22 PM This report has been signed electronically.

## 2022-06-22 NOTE — Progress Notes (Signed)
Report to PACU, RN, vss, BBS= Clear.  

## 2022-06-23 ENCOUNTER — Telehealth: Payer: Self-pay

## 2022-06-23 NOTE — Telephone Encounter (Signed)
  Follow up Call-     06/22/2022   11:03 AM  Call back number  Post procedure Call Back phone  # [phone]  Permission to leave phone message Yes     Patient questions:  Do you have a fever, pain , or abdominal swelling? No. Pain Score  0 *  Have you tolerated food without any problems? Yes.    Have you been able to return to your normal activities? Yes.    Do you have any questions about your discharge instructions: Diet   No. Medications  No. Follow up visit  No.  Do you have questions or concerns about your Care? No.  Actions: * If pain score is 4 or above: No action needed, pain <4.

## 2022-06-26 ENCOUNTER — Encounter: Payer: Self-pay | Admitting: Internal Medicine

## 2022-08-16 ENCOUNTER — Ambulatory Visit: Payer: 59 | Admitting: Internal Medicine

## 2022-08-24 ENCOUNTER — Ambulatory Visit: Payer: 59 | Admitting: Internal Medicine

## 2022-11-03 ENCOUNTER — Encounter: Payer: Self-pay | Admitting: Internal Medicine

## 2022-11-06 ENCOUNTER — Other Ambulatory Visit: Payer: Self-pay

## 2022-11-06 MED ORDER — PREDNISONE 20 MG PO TABS: ORAL_TABLET | ORAL | 0 refills | Status: AC

## 2022-11-06 NOTE — Telephone Encounter (Signed)
1.  Biologic agents are a long-term option.  However, we need to have a long discussion about the pros and cons as well as pretreatment testing prior to initiating. 2.  Have you tried prednisone before?

## 2022-12-07 ENCOUNTER — Encounter: Payer: Self-pay | Admitting: Internal Medicine

## 2022-12-07 ENCOUNTER — Ambulatory Visit: Payer: 59 | Admitting: Internal Medicine

## 2022-12-07 VITALS — BP 116/80 | HR 84 | Ht 71.0 in | Wt 177.5 lb

## 2022-12-07 DIAGNOSIS — D123 Benign neoplasm of transverse colon: Secondary | ICD-10-CM | POA: Diagnosis not present

## 2022-12-07 DIAGNOSIS — K51919 Ulcerative colitis, unspecified with unspecified complications: Secondary | ICD-10-CM

## 2022-12-07 DIAGNOSIS — Z1211 Encounter for screening for malignant neoplasm of colon: Secondary | ICD-10-CM

## 2022-12-07 MED ORDER — MESALAMINE 1.2 G PO TBEC: 1.2000 g | DELAYED_RELEASE_TABLET | Freq: Four times a day (QID) | ORAL | 11 refills | Status: DC

## 2022-12-07 NOTE — Progress Notes (Signed)
HISTORY OF PRESENT ILLNESS:  Benjamin Sheppard is a 32 y.o. male with a history of ulcerative colitis diagnosed elsewhere on sigmoidoscopy 2011.  He established with this practice and underwent colonoscopy May 2021 on no medical therapy.  This was normal.  Began having problems with loose stools and rectal bleeding December 2023.  Eventually underwent colonoscopy Jun 22, 2022 and was found to have active proctosigmoiditis to approximately 25 cm.  He was prescribed Lialda 4.8 g daily and Rowasa enemas.  He tells me that he was compliant with the all the therapy but did not want to take enemas, and did not.  He states that his bowel habits significantly improved to the point that they were solid, though still would see some blood.  In the fall he started noticing increased bowel frequency and bleeding.  He contacted this office in mid October and was concerned, particularly given his upcoming wedding.  He was prescribed a prednisone taper starting at 40 mg daily for 2 weeks, then 30 mg daily for 2 weeks, then 20 mg daily-which he is still taking.  He presents today for office follow-up as requested.  Patient tells me that several days into prednisone therapy his bowels improved dramatically.  Currently describes his bowel habits as normal.  He has formed without bleeding or cramping.  Tolerated prednisone well except for some mild acne.  Still off of Lialda.  No other complaints.  He does have multiple appropriate questions.  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   Kidney stone    Perianal abscess    Rectal bleeding 07/11/2010   Substance abuse (HCC)    UC (ulcerative colitis) (HCC) 07/11/2010   Ulcerative proctitis [facility])      Past Surgical History:  Procedure Laterality Date   kidney stone removal     SIGMOIDOSCOPY  07/2009   WISDOM TOOTH EXTRACTION  2019    Social History Trisha Campe Hinzman  reports that he has quit smoking. His smoking use included cigarettes. He has never used smokeless tobacco. He reports current alcohol use. He reports that he does not currently use drugs after having used the following drugs: Oxycodone, Marijuana, and Benzodiazepines.  family history includes Healthy in his mother; Heart attack in his father; Heart disease in his paternal grandfather; Pancreatic cancer in his paternal grandfather; Pancreatitis in his sister.  No Known Allergies     PHYSICAL EXAMINATION: Vital signs: BP 116/80 (BP Location: Left Arm, Patient Position: Sitting, Cuff Size: Normal)   Pulse 84   Ht 5\' 11"  (1.803 m)   Wt 177 lb 8 oz (80.5 kg)   SpO2 97%   BMI 24.76 kg/m   Constitutional: generally well-appearing, no acute distress Psychiatric: alert and oriented x3, cooperative Eyes: extraocular movements intact, anicteric, conjunctiva pink Mouth: oral pharynx moist, no lesions Neck: supple no lymphadenopathy Cardiovascular: heart regular rate and rhythm, no murmur Lungs: clear to auscultation bilaterally Abdomen: soft, nontender, nondistended, no obvious ascites, no peritoneal signs, normal bowel sounds, no organomegaly Rectal: Omitted Extremities: no clubbing, cyanosis, or lower extremity edema bilaterally Skin: no lesions on visible extremities Neuro: No focal deficits.  Cranial nerves intact  ASSESSMENT:  1.  Ulcerative proctosigmoiditis diagnosed in 2011.  Had a period of remission off medication.  Flare of disease December 2023 with subsequent colonoscopy May 2024 demonstrating active proctosigmoiditis.  Initially responded to Lialda then had breakthrough.  Did not want to take enemas.  Has responded to recent course of prednisone and is doing well. 2.  Sessile serrated polyp on  colonoscopy   PLAN:  1.  Continue prednisone 20 mg daily for 1 additional week, then 10 mg daily for 2 weeks, then stop. 2.  Resume Lialda 4.8 g daily 3.  Discussed the role for multiple biologic therapies (as opposed to frequent courses of prednisone). 4.  Also discussed Canasa suppositories.  He finds this more acceptable than enemas. 5.  Surveillance colonoscopy (for sessile serrated polyp) around May 2029 6.  Routine office follow-up 3 months.  Contact the office in the interim for any questions or problems A total time of 40 minutes was spent preparing to see the patient, obtaining interval history, performing medically appropriate physical exam, counseling and educating the patient regarding above listed issues, ordering medications, arranging follow-up, and documenting clinical information in the health record

## 2022-12-07 NOTE — Patient Instructions (Addendum)
With begin tapering your Prednisone:  take 20mg  for one more week, then 10mg  for 2 weeks, then stop.  We have sent the following medications to your pharmacy for you to pick up at your convenience:  Lialda  _______________________________________________________  If your blood pressure at your visit was 140/90 or greater, please contact your primary care physician to follow up on this.  _______________________________________________________  If you are age 72 or older, your body mass index should be between 23-30. Your Body mass index is 24.76 kg/m. If this is out of the aforementioned range listed, please consider follow up with your Primary Care Provider.  If you are age 67 or younger, your body mass index should be between 19-25. Your Body mass index is 24.76 kg/m. If this is out of the aformentioned range listed, please consider follow up with your Primary Care Provider.   ________________________________________________________  The [facility] providers would like to encourage you to use [facility] to communicate with providers for non-urgent requests or questions.  Due to long hold times on the telephone, sending your provider a message by [facility] may be a faster and more efficient way to get a response.  Please allow 48 business hours for a response.  Please remember that this is for non-urgent requests.  _______________________________________________________

## 2023-01-03 ENCOUNTER — Telehealth: Payer: Self-pay | Admitting: Internal Medicine

## 2023-01-03 NOTE — Telephone Encounter (Signed)
[name] please see below. Have you seen a form for Dr. Marina Goodell to fill out?

## 2023-01-03 NOTE — Telephone Encounter (Signed)
Inbound call from [employer] stating they have faxed over a form for Dr. Marina Goodell to complete. Requesting an update. Call back number is [phone] and case number is [id number]. Please advise, thank you.

## 2023-01-04 NOTE — Telephone Encounter (Signed)
Will complete what I can and then put the form on Dr. Lamar Sprinkles desk for him to finish

## 2023-02-07 ENCOUNTER — Encounter: Payer: Self-pay | Admitting: Internal Medicine

## 2023-02-07 ENCOUNTER — Ambulatory Visit: Payer: 59 | Admitting: Internal Medicine

## 2023-02-07 DIAGNOSIS — Z1211 Encounter for screening for malignant neoplasm of colon: Secondary | ICD-10-CM

## 2023-02-07 DIAGNOSIS — K5130 Ulcerative (chronic) rectosigmoiditis without complications: Secondary | ICD-10-CM

## 2023-02-07 DIAGNOSIS — D123 Benign neoplasm of transverse colon: Secondary | ICD-10-CM

## 2023-02-07 DIAGNOSIS — K51919 Ulcerative colitis, unspecified with unspecified complications: Secondary | ICD-10-CM

## 2023-02-07 MED ORDER — MESALAMINE 1.2 G PO TBEC: 4.8000 g | DELAYED_RELEASE_TABLET | Freq: Every day | ORAL | 3 refills | Status: AC

## 2023-02-07 NOTE — Progress Notes (Signed)
 HISTORY OF PRESENT ILLNESS:  Benjamin Sheppard is a 33 y.o. male  with a history of ulcerative colitis diagnosed elsewhere on sigmoidoscopy 2011.  He established with this practice and underwent colonoscopy May 2021 on no medical therapy.  This was normal.  He began having problems with loose stools and rectal bleeding December 2023, and eventually underwent colonoscopy Jun 22, 2022.  He was found to have active proctosigmoiditis to approximately 25 cm and was prescribed Lialda  4.8 g daily in addition to Rowasa  enemas.  He was compliant with Lialda  therapy but did not want to take enemas, and did not.  He states that his bowel habits significantly improved to the point that they were solid, though still would see some blood.  In the fall he started noticing increased bowel frequency and bleeding.  He contacted this office in mid October and was concerned, particularly given his upcoming wedding.  He was prescribed a prednisone  taper starting at 40 mg daily for 2 weeks, then 30 mg daily for 2 weeks, then 20 mg daily-which he is still taking at the time of his most recent follow-up December 07, 2022.   At that time his symptoms were markedly improved.  His prednisone  was tapered off over the course of 3 weeks.  He was to resume Lialda  4.8 g daily and follow-up at this time.  Patient tells me that he has had marked improvement in his bowels after completing prednisone .  Currently describes 3 or 4 formed bowel movements per day without blood.  No urgency.  Recently went to refill his Lialda  prescription and found that it was expensive (under $150).  If this was the monthly cost, he could not afford the medication.  If it was a 96-month cost, it is affordable.  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   Kidney stone    Perianal abscess     Rectal bleeding 07/11/2010   Substance abuse (HCC)    UC (ulcerative colitis) (HCC) 07/11/2010   Ulcerative proctitis [facility])     Past Surgical History:  Procedure Laterality Date   kidney stone removal     SIGMOIDOSCOPY  07/2009   WISDOM TOOTH EXTRACTION  2019    Social History Stamatis Wakely Govoni  reports that he has quit smoking. His smoking use included cigarettes. He has never used smokeless tobacco. He reports current alcohol use. He reports that he does not currently use drugs after having used the following drugs: Oxycodone, Marijuana, and Benzodiazepines.  family history includes Healthy in his mother; Heart attack in his father; Heart disease in his paternal grandfather; Pancreatic cancer in his paternal grandfather; Pancreatitis in his sister.  No Known Allergies     PHYSICAL EXAMINATION:  Vital signs: BP 120/78   Pulse 78   Ht 5\' 11"  (1.803 m)   Wt 177 lb (80.3 kg)   BMI 24.69 kg/m  General: Well-developed, well-nourished, no acute distress HEENT: Sclerae are anicteric, conjunctiva pink. Oral mucosa intact Lungs: Clear Heart: Regular Abdomen: soft, nontender, nondistended, no obvious ascites, no peritoneal signs, normal bowel sounds. No organomegaly. Extremities: No edema Psychiatric: alert and oriented x3. Cooperative    ASSESSMENT:  1.  Ulcerative proctosigmoiditis diagnosed 2011.  Most recent colonoscopy May 2024.  Recent flare responded nicely to a short course of prednisone .  Currently doing well   PLAN:  1.  I will continue Lialda  4.8 g daily.  Will check into the prescription regarding affordability.  Otherwise, could consider mesalamine  alternatives. 2.  Routine office follow-up 6 months.  We knows to contact the office in the interim for any questions or problems

## 2023-02-07 NOTE — Patient Instructions (Signed)
 We have sent the following medications to your pharmacy for you to pick up at your convenience: Lialda   _______________________________________________________  If your blood pressure at your visit was 140/90 or greater, please contact your primary care physician to follow up on this.  _______________________________________________________  If you are age 34 or older, your body mass index should be between 23-30. Your Body mass index is 24.69 kg/m. If this is out of the aforementioned range listed, please consider follow up with your Primary Care Provider.  If you are age 39 or younger, your body mass index should be between 19-25. Your Body mass index is 24.69 kg/m. If this is out of the aformentioned range listed, please consider follow up with your Primary Care Provider.   ________________________________________________________  The [facility] providers would like to encourage you to use MYCHART to communicate with providers for non-urgent requests or questions.  Due to long hold times on the telephone, sending your provider a message by [facility] may be a faster and more efficient way to get a response.  Please allow 48 business hours for a response.  Please remember that this is for non-urgent requests.  _______________________________________________________

## 2023-03-02 ENCOUNTER — Ambulatory Visit: Payer: 59 | Admitting: Internal Medicine

## 2023-03-08 ENCOUNTER — Ambulatory Visit: Payer: 59 | Admitting: Internal Medicine
